# Patient Record
Sex: Male | Born: 1960 | Race: White | Hispanic: No | Smoking: Never smoker
Health system: Southern US, Community
[De-identification: ages and names within clinical notes are randomized; demographics above are authoritative.]

## PROBLEM LIST (undated history)

## (undated) DIAGNOSIS — F329 Major depressive disorder, single episode, unspecified: Secondary | ICD-10-CM

## (undated) DIAGNOSIS — G4730 Sleep apnea, unspecified: Secondary | ICD-10-CM

## (undated) DIAGNOSIS — I10 Essential (primary) hypertension: Secondary | ICD-10-CM

## (undated) DIAGNOSIS — T7840XA Allergy, unspecified, initial encounter: Secondary | ICD-10-CM

## (undated) DIAGNOSIS — M1990 Unspecified osteoarthritis, unspecified site: Secondary | ICD-10-CM

## (undated) DIAGNOSIS — E785 Hyperlipidemia, unspecified: Secondary | ICD-10-CM

## (undated) DIAGNOSIS — F32A Depression, unspecified: Secondary | ICD-10-CM

## (undated) HISTORY — DX: Unspecified osteoarthritis, unspecified site: M19.90

## (undated) HISTORY — DX: Essential (primary) hypertension: I10

## (undated) HISTORY — DX: Depression, unspecified: F32.A

## (undated) HISTORY — DX: Major depressive disorder, single episode, unspecified: F32.9

## (undated) HISTORY — PX: FOOT SURGERY: SHX648

## (undated) HISTORY — DX: Hyperlipidemia, unspecified: E78.5

## (undated) HISTORY — DX: Sleep apnea, unspecified: G47.30

## (undated) HISTORY — PX: OTHER SURGICAL HISTORY: SHX169

## (undated) HISTORY — DX: Allergy, unspecified, initial encounter: T78.40XA

## (undated) HISTORY — PX: TONSILLECTOMY: SUR1361

---

## 2000-03-25 ENCOUNTER — Inpatient Hospital Stay (HOSPITAL_COMMUNITY): Admission: RE | Admit: 2000-03-25 | Discharge: 2000-03-26 | Payer: Self-pay | Admitting: Neurosurgery

## 2000-03-25 ENCOUNTER — Encounter: Payer: Self-pay | Admitting: Neurosurgery

## 2011-11-07 ENCOUNTER — Inpatient Hospital Stay: Payer: Self-pay | Admitting: Internal Medicine

## 2011-11-07 LAB — COMPREHENSIVE METABOLIC PANEL
Albumin: 4.1 g/dL (ref 3.4–5.0)
Alkaline Phosphatase: 79 U/L (ref 50–136)
Anion Gap: 14 (ref 7–16)
BUN: 13 mg/dL (ref 7–18)
Bilirubin,Total: 0.5 mg/dL (ref 0.2–1.0)
Calcium, Total: 8.8 mg/dL (ref 8.5–10.1)
Chloride: 103 mmol/L (ref 98–107)
Co2: 23 mmol/L (ref 21–32)
Creatinine: 0.85 mg/dL (ref 0.60–1.30)
EGFR (African American): >60
EGFR (Non-African Amer.): >60
Glucose: 127 mg/dL — ABNORMAL HIGH (ref 65–99)
Osmolality: 281 (ref 275–301)
Potassium: 4 mmol/L (ref 3.5–5.1)
SGOT(AST): 22 U/L (ref 15–37)
SGPT (ALT): 26 U/L
Sodium: 140 mmol/L (ref 136–145)
Total Protein: 8 g/dL (ref 6.4–8.2)

## 2011-11-07 LAB — CBC
HCT: 41 % (ref 40.0–52.0)
HGB: 13.9 g/dL (ref 13.0–18.0)
MCH: 30.6 pg (ref 26.0–34.0)
MCHC: 33.8 g/dL (ref 32.0–36.0)
MCV: 91 fL (ref 80–100)
Platelet: 200 10*3/uL (ref 150–440)
RBC: 4.54 10*6/uL (ref 4.40–5.90)
RDW: 13.3 % (ref 11.5–14.5)
WBC: 11.4 10*3/uL — ABNORMAL HIGH (ref 3.8–10.6)

## 2011-11-08 LAB — CBC WITH DIFFERENTIAL/PLATELET
Basophil #: 0.1 10*3/uL (ref 0.0–0.1)
Basophil %: 0.8 %
Eosinophil #: 0.3 10*3/uL (ref 0.0–0.7)
Eosinophil %: 3.8 %
HCT: 37.4 % — ABNORMAL LOW (ref 40.0–52.0)
HGB: 12.5 g/dL — ABNORMAL LOW (ref 13.0–18.0)
Lymphocyte #: 2.2 10*3/uL (ref 1.0–3.6)
Lymphocyte %: 30.3 %
MCH: 30.5 pg (ref 26.0–34.0)
MCHC: 33.5 g/dL (ref 32.0–36.0)
MCV: 91 fL (ref 80–100)
Monocyte #: 0.7 10*3/uL (ref 0.0–0.7)
Monocyte %: 10.1 %
Neutrophil #: 3.9 10*3/uL (ref 1.4–6.5)
Neutrophil %: 55 %
Platelet: 181 10*3/uL (ref 150–440)
RBC: 4.1 10*6/uL — ABNORMAL LOW (ref 4.40–5.90)
RDW: 13.3 % (ref 11.5–14.5)
WBC: 7.2 10*3/uL (ref 3.8–10.6)

## 2011-11-08 LAB — BASIC METABOLIC PANEL
Anion Gap: 8 (ref 7–16)
BUN: 10 mg/dL (ref 7–18)
Calcium, Total: 8.4 mg/dL — ABNORMAL LOW (ref 8.5–10.1)
Chloride: 106 mmol/L (ref 98–107)
Co2: 28 mmol/L (ref 21–32)
Creatinine: 1.08 mg/dL (ref 0.60–1.30)
EGFR (African American): >60
EGFR (Non-African Amer.): >60
Glucose: 96 mg/dL (ref 65–99)
Osmolality: 282 (ref 275–301)
Potassium: 5.2 mmol/L — ABNORMAL HIGH (ref 3.5–5.1)
Sodium: 142 mmol/L (ref 136–145)

## 2011-11-08 LAB — MAGNESIUM: Magnesium: 2 mg/dL

## 2011-11-11 LAB — WOUND CULTURE

## 2011-11-12 LAB — CULTURE, BLOOD (SINGLE)

## 2013-02-25 LAB — PSA

## 2014-12-10 NOTE — H&P (Signed)
PATIENT NAME:  Peter Bennett, Peter Bennett MR#:  [id number] DATE OF BIRTH:  [birth year]  DATE OF ADMISSION:  11/07/2011  REFERRING PHYSICIAN: ER physician Dr. Ladona Ridgel PRIMARY CARE PHYSICIAN: Dr. Dossie Arbour  CHIEF COMPLAINT: Right hand fifth finger and hand swelling and redness.   HISTORY OF PRESENT ILLNESS: Patient is a 54 year old male with past medical history of hypertension, depression, allergies who works in Theatre stage manager care. About seven days ago patient was working outdoors cleaning brush and he was wearing rubber gloves. He felt something stick his right fifth finger. The next day patient noticed a small white headed zit which he popped. Subsequently he developed increasing pain, redness and swelling therefore yesterday went to an urgent care where he underwent incision and drainage. He reports that wound cultures were sent from the urgent care. He was started on Norco, minocycline and Septra and sent home. The margins of the erythema were marked on his hand and he was told to seek medical attention if the redness extended the marked area of erythema. This morning patient reports that when he woke up he noticed that the redness was extending beyond the marked line therefore he came to the Emergency Room.   ALLERGIES: No known drug allergies.   PAST MEDICAL HISTORY:  1. Hypertension.  2. Seasonal allergies. 3. Depression.   PAST SURGICAL HISTORY:  1. Back surgery 2000. 2. Tonsillectomy.  3. Foot surgery.   SOCIAL HISTORY: Denies any history of smoking, alcohol or drug abuse. He lives with his mother and helps take care of her. He works in Restaurant manager, fast food care.   FAMILY HISTORY: Mother has hypertension, transient ischemic attack/mini strokes. Father had heart disease and died of congestive heart failure.   REVIEW OF SYSTEMS: CONSTITUTIONAL: Denies any fever, fatigue, weakness. EYES: Denies any blurred or double vision. ENT: Denies tinnitus, ear pain. RESPIRATORY: Denies any cough,  wheezing. CARDIOVASCULAR: Denies any chest pain, orthopnea. GASTROINTESTINAL: Denies any nausea, vomiting, diarrhea, abdominal pain. GENITOURINARY: Denies any dysuria, hematuria. ENDOCRINE: Denies any polyuria, nocturia. HEME/LYMPH: Denies anemia, easy bruisability. INTEGUMENT: Denies any acne, rash. MUSCULOSKELETAL: Reports pain in his right hand and fifth finger. NEUROLOGICAL: Denies any numbness, weakness. PSYCH: Reports a history of anxiety and depression.   PHYSICAL EXAMINATION:  VITAL SIGNS: Temperature 98.5, heart rate 82, respiratory rate 18, blood pressure 153/76, pulse 97% on room air.   GENERAL: Patient is a 54 year old Caucasian male laying comfortably in bed, not in acute distress.   HEAD: Atraumatic, normocephalic.   EYES: There is no pallor, icterus or cyanosis. Pupils equal, round, and reactive to light and accommodation. Extraocular movements intact.   ENT: Wet mucous membranes. No oropharyngeal erythema or thrush.   NECK: Supple. No masses. No JVD. No thyromegaly. No lymphadenopathy.   CHEST WALL: No tenderness to palpation. Not using accessory muscles of respiration. No intercostal muscle retractions.   LUNGS: Bilaterally clear to auscultation. No wheezing, rales, rhonchi.   CARDIOVASCULAR: S1, S2 regular. No murmur, rubs, or gallops.   ABDOMEN: Soft, nontender, nondistended. No guarding. No rigidity. No organomegaly. Normal bowel sounds.   SKIN: No rashes or lesions.   PERIPHERIES: No pedal edema. 2+ pedal pulses.   MUSCULOSKELETAL: No cyanosis, clubbing.   NEUROLOGIC: Awake, alert, oriented x3. Nonfocal neurological exam. Cranial nerves grossly intact.   PSYCH: Normal mood and affect.   RIGHT HAND: There is about a 1 cm incision on the dorsal surface of the patient's right fifth finger surrounded by blackish discoloration possibly Betadine which was applied during incision and drainage  done yesterday. There is some purulent discharge. The area is fluctuant and  very tender. There is erythema extending into the patient's hand and up to the wrist. It is extending beyond the marked line on the hand. Range of motion is reduced in the finger but good in the wrist. He has good pulses.   LABORATORY, DIAGNOSTIC AND RADIOLOGICAL DATA: Blood cultures have been sent. Glucose 127, res of complete metabolic panel is normal. White count 11.4, rest of CBC is normal.   ASSESSMENT AND PLAN: 54 year old male with past medical history of hypertension, allergies, depression presents with right hand abscess/cellulitis.  1. Right hand abscess/cellulitis. Patient reports that he was stuck with something while doing lawn work about a week ago. Subsequently he developed a zit which he popped resulting in increasing pain, redness and swelling. Patient had an incision and drainage done at urgent care 11/06/2011 and was started on antibiotics but presented to the Emergency Room because of increasing erythema extending into his hand. Blood cultures have been sent from the Emergency Room. Will get wound cultures and start on empiric antibiotics, vancomycin and Zosyn, to cover for MRSA, gram-positive, gram-negative and anaerobic bacteria. Will also get an orthopedic consultation. Patient may require further incision and drainage. Also advised patient to apply some ice pack and elevate the hand. Patient's wound also needs to be cleaned and dressed. 2. Hyperglycemia, possibly reactive. Patient has no history of diabetes. 3. Leukocytosis, mild possibly due to acute infection. Will monitor.  4. Hypertension. Mildly elevated possibly due to acute stress. Will continue benazepril and adjust medications as needed to achieve good hypertensive control.  5. Seasonal allergies. Will continue Zyrtec. 6. History of depression. Will continue Effexor.  7. Will place on GI and DVT prophylaxis.   Discussed with patient the plan of care and management.   TIME SPENT: 75 minutes.    ____________________________ Darrick MeigsSangeeta Ashey Tramontana, MD sp:cms D: 11/07/2011 08:10:45 ET T: 11/07/2011 08:41:25 ET JOB#: [id number]  cc: Darrick MeigsSangeeta Joe Tanney, MD, <Dictator> Steele SizerMark A. Crissman, MD Darrick MeigsSANGEETA Chika Cichowski MD ELECTRONICALLY SIGNED 11/07/2011 13:43

## 2014-12-10 NOTE — Consult Note (Signed)
PATIENT NAME:  Peter Bennett, Kenta E MR#:  [id number] DATE OF BIRTH:  [birth year]  DATE OF CONSULTATION:  11/08/2011  REFERRING PHYSICIAN:  Dr. Dava NajjarPanwar  CONSULTING PHYSICIAN:  Winn JockJames C. Kathrine Rieves, MD  REASON FOR CONSULTATION: 54 year old male with an infection of his right fifth digit.   HISTORY OF PRESENT ILLNESS: The patient suffered what may have been a snake bite to his right fifth digit approximately five days ago. He said he was clearing brush and felt an acute pressure and sharp pain in his right small finger dorsal aspect. He noticed a small reddened area which became progressively worse. He noticed a small carbuncle for which apparently he went to Urgent Care. He underwent incision and drainage. Wound cultures were sent. He was placed on Norco, minocycline, and Septra. He continued to have discomfort. He presented to the Emergency Room on 03/22 and was evaluated by Medicine and admitted.   No prior difficulty with his hand.   PAST MEDICAL HISTORY:  1. Hypertension.  2. Seasonal allergies. 3. Depression.   PAST SURGICAL HISTORY:  1. Back surgery.  2. Tonsillectomy. 3. Foot surgery.   SOCIAL HISTORY: Does not smoke or drink. He lives with his mother, helps to take care of her, works in Aeronautical engineerlandscaping.   FAMILY HISTORY: Notable for hypertension and vascular disease.   REVIEW OF SYSTEMS: Unremarkable for any acute GI or GU symptoms. No fevers, chills, or constitutional symptoms.   PHYSICAL EXAMINATION: Well developed male. He is alert and oriented. Hand is in a soft dressing. This is removed. There is approximately a 1 cm long area which is open. There was surrounding erythema. There was erythema on the dorsum of the hand. There is diffuse tenderness. There is marked decreased range of motion of the fifth digit actively. Passively range of motion is full but mildly painful. Slight purulence is present. Neurovascular examination is intact. There were no epitrochlear nodes about the elbow.    LABORATORY DATA: Laboratory work is reviewed. Culture has shown Staph aureus. Blood cultures were unremarkable. White count 11.4. Differential is mildly elevated.   IMPRESSION: Wound dorsum of the fifth digit most likely from a snake bite. There is significant infection but he has improved a little bit since yesterday. Based on this would recommend exploring the wound, possible I and D. Will do this in the room under local. This is with informed consent.   PROCEDURE: Betadine prep followed by digital block and infiltration of wound with plain Xylocaine. The area is explored and debrided. There is minimal purulence. There is one small piece of necrotic-type tissue which is cleaned out with a swab. The wound is very well defined. The area is irrigated with more local. A soft sterile dressing is applied.   IMPRESSION: Infection, improving. Very well defined. Recommend changing to p.o. antibiotics and discharging him when final sensitivities are in. Will see him back in the office in 7 to 10 days.   ____________________________ Winn JockJames C. Gerrit Heckaliff, MD jcc:drc D: 11/09/2011 15:15:54 ET T: 11/09/2011 15:47:20 ET JOB#: [id number]  cc: Winn JockJames C. Gerrit Heckaliff, MD, <Dictator> Winn JockJAMES C Isais Klipfel MD ELECTRONICALLY SIGNED 12/02/2011 12:54

## 2014-12-10 NOTE — Discharge Summary (Signed)
PATIENT NAME:  Peter Bennett, Wing E MR#:  [id number] DATE OF BIRTH:  03-Jun-1961  DATE OF ADMISSION:  11/07/2011 DATE OF DISCHARGE:  11/09/2011  DISCHARGE DIAGNOSES:  1. Right hand cellulitis, right fifth finger abscess. 2. Hypertension. 3. Allergies.  4. Depression.  5. Leukocytosis.  6. Hyperglycemia.   DISPOSITION: The patient is being discharged home.   DISCHARGE FOLLOWUP: Followup with Dr. Dossie Arbourrissman in 1 to 2 weeks after discharge. Followup with  Dr. Gerrit Heckaliff in 2 to 3 days after discharge.   DIET: Low sodium.   ACTIVITY: As tolerated.   DISCHARGE INSTRUCTIONS: The patient has been advised that he can shower, but he needs to keep his right hand clean and dry. He should clean the area with Betadine and apply a dressing twice a day.   DISCHARGE MEDICATIONS:  1. Bactrim double-strength one tablet p.o. twice a day for 10 days.  2. Doxycycline 100 mg p.o. twice a day for 10 days.  3. Norco 5/325 mg every six hours p.r.n.  4. Effexor 150 mg daily.  5. Aspirin 81 mg daily. 6. Benazepril 10 mg daily. 7. Zyrtec 10 mg daily.   RESULTS: Wound culture grew MRSA.   White count 11.4 on admission and 7.2 by the time of discharge. The rest of the CBC essentially normal. CMP essentially normal.   HOSPITAL COURSE: The patient is a 54 year old male with past medical history of hypertension and allergies who works in [employer] business. He presented with right hand cellulitis and right fifth finger abscess. For additional details, please see the History and Physical. The patient was admitted to the hospital because he did not respond to outpatient antibiotics. He was started on vancomycin and Zosyn and an orthopedic consultation with Dr. Gerrit Heckaliff was obtained who did a bedside incision and drainage. The wound culture grew MRSA. Clinically over the last 48 hours there has been significant improvement in the patient's cellulitis. He is being discharged home on oral antibiotics. He has been advised to  follow-up with Dr. Gerrit Heckaliff in 2 to 3 days after discharge. He had some hyperglycemia. His hemoglobin A1c is 5.9. His leukocytosis has resolved. The rest of his medical care problems remained stable during the hospitalization. He is being discharged home in a stable condition.   TIME SPENT: 45 minutes.  ____________________________ Darrick MeigsSangeeta An Lannan, MD sp:slb D: 11/09/2011 14:03:09 ET T: 11/10/2011 09:57:06 ET JOB#: [id number]  cc: Darrick MeigsSangeeta Zayanna Pundt, MD, <Dictator> Steele SizerMark A. Crissman, MD Darrick MeigsSANGEETA Tommye Lehenbauer MD ELECTRONICALLY SIGNED 11/10/2011 16:41

## 2014-12-10 NOTE — Consult Note (Signed)
Brief Consult Note: Diagnosis: Right hand celluitis/abcess.   Patient was seen by consultant.   Comments: Will check back in AM. If not improved, will take to OR. Agree with present antibiotics.  Electronic Signatures: Celesta Gentilealiff, Jedidiah Demartini C (MD)  (Signed 22-Mar-13 19:32)  Authored: Brief Consult Note   Last Updated: 22-Mar-13 19:32 by Celesta Gentilealiff, Zafirah Vanzee C (MD)

## 2015-03-31 ENCOUNTER — Other Ambulatory Visit: Payer: Self-pay | Admitting: Unknown Physician Specialty

## 2015-04-12 DIAGNOSIS — G4733 Obstructive sleep apnea (adult) (pediatric): Secondary | ICD-10-CM | POA: Insufficient documentation

## 2015-04-12 DIAGNOSIS — G4730 Sleep apnea, unspecified: Secondary | ICD-10-CM | POA: Insufficient documentation

## 2015-04-12 DIAGNOSIS — E785 Hyperlipidemia, unspecified: Secondary | ICD-10-CM | POA: Insufficient documentation

## 2015-04-12 DIAGNOSIS — F32A Depression, unspecified: Secondary | ICD-10-CM

## 2015-04-12 DIAGNOSIS — F329 Major depressive disorder, single episode, unspecified: Secondary | ICD-10-CM

## 2015-04-12 DIAGNOSIS — J309 Allergic rhinitis, unspecified: Secondary | ICD-10-CM | POA: Insufficient documentation

## 2015-04-12 DIAGNOSIS — N529 Male erectile dysfunction, unspecified: Secondary | ICD-10-CM | POA: Insufficient documentation

## 2015-04-12 DIAGNOSIS — E781 Pure hyperglyceridemia: Secondary | ICD-10-CM

## 2015-04-12 DIAGNOSIS — F339 Major depressive disorder, recurrent, unspecified: Secondary | ICD-10-CM | POA: Insufficient documentation

## 2015-04-12 DIAGNOSIS — I10 Essential (primary) hypertension: Secondary | ICD-10-CM

## 2015-04-16 ENCOUNTER — Ambulatory Visit (INDEPENDENT_AMBULATORY_CARE_PROVIDER_SITE_OTHER): Payer: Self-pay | Admitting: Unknown Physician Specialty

## 2015-04-16 ENCOUNTER — Encounter: Payer: Self-pay | Admitting: Unknown Physician Specialty

## 2015-04-16 VITALS — BP 121/70 | HR 71 | Temp 98.2°F | Ht 70.3 in | Wt 214.8 lb

## 2015-04-16 DIAGNOSIS — F32A Depression, unspecified: Secondary | ICD-10-CM

## 2015-04-16 DIAGNOSIS — N529 Male erectile dysfunction, unspecified: Secondary | ICD-10-CM

## 2015-04-16 DIAGNOSIS — I10 Essential (primary) hypertension: Secondary | ICD-10-CM

## 2015-04-16 DIAGNOSIS — F329 Major depressive disorder, single episode, unspecified: Secondary | ICD-10-CM

## 2015-04-16 MED ORDER — CYCLOBENZAPRINE HCL 10 MG PO TABS: 10.0000 mg | ORAL_TABLET | Freq: Three times a day (TID) | ORAL | Status: DC | PRN

## 2015-04-16 MED ORDER — VENLAFAXINE HCL ER 150 MG PO CP24: 150.0000 mg | ORAL_CAPSULE | Freq: Every day | ORAL | Status: DC

## 2015-04-16 MED ORDER — VARDENAFIL HCL 10 MG PO TABS: 10.0000 mg | ORAL_TABLET | Freq: Every day | ORAL | Status: DC | PRN

## 2015-04-16 MED ORDER — BENAZEPRIL HCL 20 MG PO TABS: 20.0000 mg | ORAL_TABLET | Freq: Every day | ORAL | Status: DC

## 2015-04-16 NOTE — Assessment & Plan Note (Signed)
Stable, continue present medications.   

## 2015-04-16 NOTE — Progress Notes (Signed)
BP 121/70 mmHg  Pulse 71  Temp(Src) 98.2 F (36.8 C)  Ht 5' 10.3" (1.786 m)  Wt 214 lb 12.8 oz (97.433 kg)  BMI 30.55 kg/m2  SpO2 99%   Subjective:    Patient ID: Peter Bennett, male    DOB: Feb 10, 1961, 54 y.o.   MRN: [id number]  HPI: Peter Bennett is a 54 y.o. male  Chief Complaint  Patient presents with  . Depression    pt states he needs refills on all medications  . Hypertension   1.  DEPRESSION Duration:  chronic H4 Depressed mood:  yes  H2 .H: Depression Screen X  Anhedonia:  yes  H3 Significant weight loss or gain:  no  H8 Insomnia:  yes  hard to stay asleep.  H8 Hypersomnia:  yes  H8 Fatigue/loss of energy:  yes  H8 Feelings of worthlessness:  no  H8 Feelings of guilt:  no  H8 Impaired concentration/indecisiveness:  no  H8 Suicidal ideations:  no  H8 Crying spells:  no  H8 Anxious mood:  no  H8  Recent Stressors/Life Changes:   2.  HYPERTENSION  Hypertension status:  controlled  H6  Satisfied with current treatment?  yes  H6  Duration of hypertension:  chronic  H4  BP monitoring frequency:  not checking.  H5  BP medication side effects:  no P1  Medication compliance:  excellent compliance  P1  Previous BP meds:  none  P1  Aspirin:  Aspirin Therapy Done on 03/12/09 Recurrent headaches:  no  R10 Visual changes:  no  R2  Palpitations:  no  R4  Dyspnea:  no  R5  Chest pain:  no  R4 Lower extremity edema:  no  R4 Dizzy/lightheaded:  no  R10     Relevant past medical, surgical, family and social history reviewed and updated as indicated. Interim medical history since our last visit reviewed. Allergies and medications reviewed and updated.  Review of Systems  Per HPI unless specifically indicated above     Objective:    BP 121/70 mmHg  Pulse 71  Temp(Src) 98.2 F (36.8 C)  Ht 5' 10.3" (1.786 m)  Wt 214 lb 12.8 oz (97.433 kg)  BMI 30.55 kg/m2  SpO2 99%  Wt Readings from Last 3 Encounters:  04/16/15 214 lb 12.8 oz (97.433 kg)   09/26/14 226 lb (102.513 kg)    Physical Exam  Constitutional: He is oriented to person, place, and time. He appears well-developed and well-nourished. No distress.  HENT:  Head: Normocephalic and atraumatic.  Eyes: Conjunctivae and lids are normal. Right eye exhibits no discharge. Left eye exhibits no discharge. No scleral icterus.  Cardiovascular: Normal rate, regular rhythm and normal heart sounds.   Pulmonary/Chest: Effort normal and breath sounds normal. No respiratory distress.  Abdominal: Normal appearance. There is no splenomegaly or hepatomegaly. There is no tenderness.  Musculoskeletal: Normal range of motion.  Neurological: He is alert and oriented to person, place, and time.  Skin: Skin is intact. No rash noted. No pallor.  Psychiatric: He has a normal mood and affect. His behavior is normal. Judgment and thought content normal.      Assessment & Plan:   Problem List Items Addressed This Visit      Unprioritized   Depression    Stable, continue present medications.       Relevant Medications   venlafaxine XR (EFFEXOR-XR) 150 MG 24 hr capsule   ED (erectile dysfunction)    Stable, continue present medications.  Hypertension - Primary    Stable, continue present medications.       Relevant Medications   benazepril (LOTENSIN) 20 MG tablet   vardenafil (LEVITRA) 10 MG tablet       Follow up plan: Return in about 6 months (around 10/16/2015) for phyxical.

## 2015-10-16 ENCOUNTER — Encounter: Payer: Self-pay | Admitting: Family Medicine

## 2015-10-25 ENCOUNTER — Encounter: Payer: Self-pay | Admitting: Family Medicine

## 2015-10-25 ENCOUNTER — Ambulatory Visit (INDEPENDENT_AMBULATORY_CARE_PROVIDER_SITE_OTHER): Payer: Self-pay | Admitting: Family Medicine

## 2015-10-25 VITALS — BP 132/79 | HR 86 | Temp 98.6°F | Ht 69.5 in | Wt 227.0 lb

## 2015-10-25 DIAGNOSIS — F329 Major depressive disorder, single episode, unspecified: Secondary | ICD-10-CM

## 2015-10-25 DIAGNOSIS — I10 Essential (primary) hypertension: Secondary | ICD-10-CM

## 2015-10-25 DIAGNOSIS — F32A Depression, unspecified: Secondary | ICD-10-CM

## 2015-10-25 MED ORDER — VENLAFAXINE HCL ER 150 MG PO CP24: 150.0000 mg | ORAL_CAPSULE | Freq: Every day | ORAL | Status: DC

## 2015-10-25 MED ORDER — CYCLOBENZAPRINE HCL 10 MG PO TABS: 10.0000 mg | ORAL_TABLET | Freq: Three times a day (TID) | ORAL | Status: DC | PRN

## 2015-10-25 MED ORDER — TRIAMCINOLONE ACETONIDE 0.1 % EX CREA: 1.0000 "application " | TOPICAL_CREAM | Freq: Two times a day (BID) | CUTANEOUS | Status: DC

## 2015-10-25 MED ORDER — BENAZEPRIL HCL 20 MG PO TABS: 20.0000 mg | ORAL_TABLET | Freq: Every day | ORAL | Status: DC

## 2015-10-25 NOTE — Assessment & Plan Note (Signed)
The current medical regimen is effective;  continue present plan and medications.  

## 2015-10-25 NOTE — Progress Notes (Signed)
   BP 132/79 mmHg  Pulse 86  Temp(Src) 98.6 F (37 C)  Ht 5' 9.5" (1.765 m)  Wt 227 lb (102.967 kg)  BMI 33.05 kg/m2  SpO2 99%   Subjective:    Patient ID: Peter Bennett, male    DOB: 01-19-1961, 55 y.o.   MRN: [id number]  HPI: Peter Bennett is a 55 y.o. male  Chief Complaint  Patient presents with  . medication refills   patient follow-up doing well no complaints from blood pressure blood pressures get at home here in no side effects from medications which he takes faithfully Nerves doing well with Effexor no complaints Allergies doing fine has some occasional hip pains wants some Flexeril hasn't taken any for a long time but wants to have a refill just in case   Relevant past medical, surgical, family and social history reviewed and updated as indicated. Interim medical history since our last visit reviewed. Allergies and medications reviewed and updated.  Review of Systems  Constitutional: Negative.   Respiratory: Negative.   Cardiovascular: Negative.     Per HPI unless specifically indicated above     Objective:    BP 132/79 mmHg  Pulse 86  Temp(Src) 98.6 F (37 C)  Ht 5' 9.5" (1.765 m)  Wt 227 lb (102.967 kg)  BMI 33.05 kg/m2  SpO2 99%  Wt Readings from Last 3 Encounters:  10/25/15 227 lb (102.967 kg)  04/16/15 214 lb 12.8 oz (97.433 kg)  09/26/14 226 lb (102.513 kg)    Physical Exam  Constitutional: He is oriented to person, place, and time. He appears well-developed and well-nourished. No distress.  HENT:  Head: Normocephalic and atraumatic.  Right Ear: Hearing normal.  Left Ear: Hearing normal.  Nose: Nose normal.  Eyes: Conjunctivae and lids are normal. Right eye exhibits no discharge. Left eye exhibits no discharge. No scleral icterus.  Pulmonary/Chest: Effort normal. No respiratory distress.  Musculoskeletal: Normal range of motion.  Neurological: He is alert and oriented to person, place, and time.  Skin: Skin is intact. No rash noted.   Psychiatric: He has a normal mood and affect. His speech is normal and behavior is normal. Judgment and thought content normal. Cognition and memory are normal.    Results for orders placed or performed in visit on 04/12/15  PSA  Result Value Ref Range   PSA from PP       Assessment & Plan:   Problem List Items Addressed This Visit      Cardiovascular and Mediastinum   Hypertension - Primary    The current medical regimen is effective;  continue present plan and medications.       Relevant Medications   benazepril (LOTENSIN) 20 MG tablet     Other   Depression    The current medical regimen is effective;  continue present plan and medications.       Relevant Medications   venlafaxine XR (EFFEXOR-XR) 150 MG 24 hr capsule       Follow up plan: Return in about 6 months (around 04/26/2016) for med check.

## 2015-10-26 LAB — BASIC METABOLIC PANEL
BUN / CREAT RATIO: 20 (ref 9–20)
BUN: 14 mg/dL (ref 6–24)
CHLORIDE: 101 mmol/L (ref 96–106)
CO2: 24 mmol/L (ref 18–29)
Calcium: 8.9 mg/dL (ref 8.7–10.2)
Creatinine, Ser: 0.69 mg/dL — ABNORMAL LOW (ref 0.76–1.27)
GFR calc Af Amer: 123 mL/min/{1.73_m2} (ref >59)
GFR calc non Af Amer: 106 mL/min/{1.73_m2} (ref >59)
Glucose: 73 mg/dL (ref 65–99)
POTASSIUM: 4.5 mmol/L (ref 3.5–5.2)
SODIUM: 141 mmol/L (ref 134–144)

## 2015-10-29 ENCOUNTER — Encounter: Payer: Self-pay | Admitting: Family Medicine

## 2016-04-29 ENCOUNTER — Ambulatory Visit: Payer: Self-pay | Admitting: Family Medicine

## 2016-05-01 ENCOUNTER — Other Ambulatory Visit: Payer: Self-pay | Admitting: Family Medicine

## 2016-05-05 ENCOUNTER — Ambulatory Visit: Payer: Self-pay | Admitting: Family Medicine

## 2016-05-22 ENCOUNTER — Ambulatory Visit (INDEPENDENT_AMBULATORY_CARE_PROVIDER_SITE_OTHER): Payer: Self-pay | Admitting: Family Medicine

## 2016-05-22 ENCOUNTER — Encounter: Payer: Self-pay | Admitting: Family Medicine

## 2016-05-22 DIAGNOSIS — F3342 Major depressive disorder, recurrent, in full remission: Secondary | ICD-10-CM

## 2016-05-22 DIAGNOSIS — G4733 Obstructive sleep apnea (adult) (pediatric): Secondary | ICD-10-CM

## 2016-05-22 DIAGNOSIS — L989 Disorder of the skin and subcutaneous tissue, unspecified: Secondary | ICD-10-CM | POA: Insufficient documentation

## 2016-05-22 DIAGNOSIS — I10 Essential (primary) hypertension: Secondary | ICD-10-CM

## 2016-05-22 MED ORDER — VENLAFAXINE HCL ER 150 MG PO CP24: 150.0000 mg | ORAL_CAPSULE | Freq: Every day | ORAL | 2 refills | Status: DC

## 2016-05-22 MED ORDER — BENAZEPRIL HCL 20 MG PO TABS
20.0000 mg | ORAL_TABLET | Freq: Every day | ORAL | 2 refills | Status: DC
Start: 2016-05-22 — End: 2017-03-23

## 2016-05-22 NOTE — Assessment & Plan Note (Signed)
Discuss multiple actinic keratosis will refer to dermatology for skin check

## 2016-05-22 NOTE — Assessment & Plan Note (Signed)
Discuss using CPAP again K fresh prescription for new supplies may help fatigue

## 2016-05-22 NOTE — Assessment & Plan Note (Signed)
The current medical regimen is effective;  continue present plan and medications.  

## 2016-05-22 NOTE — Progress Notes (Signed)
BP 124/76 (BP Location: Left Arm, Patient Position: Sitting, Cuff Size: Normal)   Pulse 80   Temp 98.1 F (36.7 C)   Ht 5\' 10"  (1.778 m)   Wt 225 lb 12.8 oz (102.4 kg)   SpO2 99%   BMI 32.40 kg/m    Subjective:    Patient ID: Peter Bennett, male    DOB: [birth year], 55 y.o.   MRN: [id number]  HPI: Peter Bennett is a 55 y.o. male  Chief Complaint  Patient presents with  . Follow-up  . Med Check   Patient with marked ongoing fatigue can sleep 12+ hours not really depressed but but tired all the time. Not using CPAP or has not used for some time Taking Effexor faithfully without side effects Takes blood pressure without problems  Relevant past medical, surgical, family and social history reviewed and updated as indicated. Interim medical history since our last visit reviewed. Allergies and medications reviewed and updated.  Review of Systems  Constitutional: Negative.   Respiratory: Negative.   Cardiovascular: Negative.     Per HPI unless specifically indicated above     Objective:    BP 124/76 (BP Location: Left Arm, Patient Position: Sitting, Cuff Size: Normal)   Pulse 80   Temp 98.1 F (36.7 C)   Ht 5\' 10"  (1.778 m)   Wt 225 lb 12.8 oz (102.4 kg)   SpO2 99%   BMI 32.40 kg/m   Wt Readings from Last 3 Encounters:  05/22/16 225 lb 12.8 oz (102.4 kg)  10/25/15 227 lb (103 kg)  04/16/15 214 lb 12.8 oz (97.4 kg)    Physical Exam  Constitutional: He is oriented to person, place, and time. He appears well-developed and well-nourished. No distress.  HENT:  Head: Normocephalic and atraumatic.  Right Ear: Hearing normal.  Left Ear: Hearing normal.  Nose: Nose normal.  Eyes: Conjunctivae and lids are normal. Right eye exhibits no discharge. Left eye exhibits no discharge. No scleral icterus.  Cardiovascular: Normal rate, regular rhythm and normal heart sounds.   Pulmonary/Chest: Effort normal and breath sounds normal. No respiratory distress.    Musculoskeletal: Normal range of motion.  Neurological: He is alert and oriented to person, place, and time.  Skin: Skin is intact. No rash noted.  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 His speech is normal and behavior is normal. Judgment and thought content normal. Cognition and memory are normal.    Results for orders placed or performed in visit on 10/25/15  Basic metabolic panel  Result Value Ref Range   Glucose 73 65 - 99 mg/dL   BUN 14 6 - 24 mg/dL   Creatinine, Ser 7.820.69 (L) 0.76 - 1.27 mg/dL   GFR calc non Af Amer 106 >59 mL/min/1.73   GFR calc Af Amer 123 >59 mL/min/1.73   BUN/Creatinine Ratio 20 9 - 20   Sodium 141 134 - 144 mmol/L   Potassium 4.5 3.5 - 5.2 mmol/L   Chloride 101 96 - 106 mmol/L   CO2 24 18 - 29 mmol/L   Calcium 8.9 8.7 - 10.2 mg/dL      Assessment & Plan:   Problem List Items Addressed This Visit      Cardiovascular and Mediastinum   Hypertension    The current medical regimen is effective;  continue present plan and medications.       Relevant Medications   benazepril (LOTENSIN) 20 MG tablet   Other Relevant  Orders   Basic metabolic panel     Respiratory   Sleep apnea    Discuss using CPAP again K fresh prescription for new supplies may help fatigue        Musculoskeletal and Integument   Skin lesions    Discuss multiple actinic keratosis will refer to dermatology for skin check        Other   Depression    The current medical regimen is effective;  continue present plan and medications.       Relevant Medications   venlafaxine XR (EFFEXOR-XR) 150 MG 24 hr capsule    Other Visit Diagnoses   None.      Follow up plan: Return in about 6 months (around 11/20/2016) for Physical Exam.

## 2016-05-23 LAB — BASIC METABOLIC PANEL
BUN/Creatinine Ratio: 20 (ref 9–20)
BUN: 16 mg/dL (ref 6–24)
CALCIUM: 9.4 mg/dL (ref 8.7–10.2)
CO2: 27 mmol/L (ref 18–29)
CREATININE: 0.82 mg/dL (ref 0.76–1.27)
Chloride: 99 mmol/L (ref 96–106)
GFR, EST AFRICAN AMERICAN: 114 mL/min/{1.73_m2} (ref >59)
GFR, EST NON AFRICAN AMERICAN: 99 mL/min/{1.73_m2} (ref >59)
Glucose: 68 mg/dL (ref 65–99)
POTASSIUM: 4.4 mmol/L (ref 3.5–5.2)
SODIUM: 139 mmol/L (ref 134–144)

## 2016-05-26 ENCOUNTER — Encounter: Payer: Self-pay | Admitting: Family Medicine

## 2016-06-17 ENCOUNTER — Other Ambulatory Visit: Payer: Self-pay

## 2016-06-17 MED ORDER — FLUTICASONE PROPIONATE 50 MCG/ACT NA SUSP: 2.0000 | Freq: Every day | NASAL | 12 refills | Status: DC

## 2016-06-17 NOTE — Telephone Encounter (Signed)
Routing to provider  

## 2016-07-22 ENCOUNTER — Ambulatory Visit (INDEPENDENT_AMBULATORY_CARE_PROVIDER_SITE_OTHER): Payer: Self-pay | Admitting: Family Medicine

## 2016-07-22 ENCOUNTER — Encounter: Payer: Self-pay | Admitting: Family Medicine

## 2016-07-22 DIAGNOSIS — F3342 Major depressive disorder, recurrent, in full remission: Secondary | ICD-10-CM

## 2016-07-22 DIAGNOSIS — I10 Essential (primary) hypertension: Secondary | ICD-10-CM

## 2016-07-22 NOTE — Assessment & Plan Note (Signed)
The current medical regimen is effective;  continue present plan and medications.  

## 2016-07-22 NOTE — Progress Notes (Signed)
   BP 124/78 (BP Location: Left Arm, Patient Position: Sitting, Cuff Size: Normal)   Pulse 81   Temp 97.8 F (36.6 C)   Ht 5' 10.75" (1.797 m)   Wt 227 lb 12.8 oz (103.3 kg)   SpO2 99%   BMI 32.00 kg/m    Subjective:    Patient ID: Peter Bennett, male    DOB: 1961-04-02, 55 y.o.   MRN: [id number]  HPI: Peter Bennett is a 55 y.o. male  Chief Complaint  Patient presents with  . Paperwork  Patient for [facility] 352DMV paperwork for blood pressure and depression otherwise been doing well no complaints with blood pressure or depression medications taken faithfully without problems  Relevant past medical, surgical, family and social history reviewed and updated as indicated. Interim medical history since our last visit reviewed. Allergies and medications reviewed and updated.  Review of Systems  Constitutional: Negative.   Respiratory: Negative.   Cardiovascular: Negative.     Per HPI unless specifically indicated above     Objective:    BP 124/78 (BP Location: Left Arm, Patient Position: Sitting, Cuff Size: Normal)   Pulse 81   Temp 97.8 F (36.6 C)   Ht 5' 10.75" (1.797 m)   Wt 227 lb 12.8 oz (103.3 kg)   SpO2 99%   BMI 32.00 kg/m   Wt Readings from Last 3 Encounters:  07/22/16 227 lb 12.8 oz (103.3 kg)  05/22/16 225 lb 12.8 oz (102.4 kg)  10/25/15 227 lb (103 kg)    Physical Exam  Constitutional: He is oriented to person, place, and time. He appears well-developed and well-nourished. No distress.  HENT:  Head: Normocephalic and atraumatic.  Right Ear: Hearing normal.  Left Ear: Hearing normal.  Nose: Nose normal.  Eyes: Conjunctivae and lids are normal. Right eye exhibits no discharge. Left eye exhibits no discharge. No scleral icterus.  Pulmonary/Chest: Effort normal. No respiratory distress.  Musculoskeletal: Normal range of motion.  Neurological: He is alert and oriented to person, place, and time.  Skin: Skin is intact. No rash noted.  Psychiatric: He has a  normal mood and affect. His speech is normal and behavior is normal. Judgment and thought content normal. Cognition and memory are normal.    Results for orders placed or performed in visit on 05/22/16  Basic metabolic panel  Result Value Ref Range   Glucose 68 65 - 99 mg/dL   BUN 16 6 - 24 mg/dL   Creatinine, Ser 4.090.82 0.76 - 1.27 mg/dL   GFR calc non Af Amer 99 >59 mL/min/1.73   GFR calc Af Amer 114 >59 mL/min/1.73   BUN/Creatinine Ratio 20 9 - 20   Sodium 139 134 - 144 mmol/L   Potassium 4.4 3.5 - 5.2 mmol/L   Chloride 99 96 - 106 mmol/L   CO2 27 18 - 29 mmol/L   Calcium 9.4 8.7 - 10.2 mg/dL      Assessment & Plan:   Problem List Items Addressed This Visit      Cardiovascular and Mediastinum   Hypertension    The current medical regimen is effective;  continue present plan and medications.         Other   Depression    The current medical regimen is effective;  continue present plan and medications.          DMV form filled out see copy for details Follow up plan: Return for As scheduled.

## 2016-12-04 ENCOUNTER — Encounter: Payer: Self-pay | Admitting: Family Medicine

## 2017-03-20 ENCOUNTER — Other Ambulatory Visit: Payer: Self-pay | Admitting: Family Medicine

## 2017-03-23 MED ORDER — VENLAFAXINE HCL ER 150 MG PO CP24: 150.0000 mg | ORAL_CAPSULE | Freq: Every day | ORAL | 0 refills | Status: DC

## 2017-03-23 MED ORDER — BENAZEPRIL HCL 20 MG PO TABS: 20.0000 mg | ORAL_TABLET | Freq: Every day | ORAL | 0 refills | Status: DC

## 2017-03-23 NOTE — Telephone Encounter (Signed)
apt 

## 2017-03-23 NOTE — Telephone Encounter (Signed)
Last OV: 07/22/16 Next OV: None on file.   BMP Latest Ref Rng & Units 05/22/2016 10/25/2015 11/08/2011  Glucose 65 - 99 mg/dL 68 73 96  BUN 6 - 24 mg/dL 16 14 10   Creatinine 0.76 - 1.27 mg/dL 1.610.82 0.96(E0.69(L) 4.541.08  BUN/Creat Ratio 9 - 20 20 20  -  Sodium 134 - 144 mmol/L 139 141 142  Potassium 3.5 - 5.2 mmol/L 4.4 4.5 5.2(H)  Chloride 96 - 106 mmol/L 99 101 106  CO2 18 - 29 mmol/L 27 24 28   Calcium 8.7 - 10.2 mg/dL 9.4 8.9 0.9(W8.4(L)

## 2017-05-25 ENCOUNTER — Ambulatory Visit (INDEPENDENT_AMBULATORY_CARE_PROVIDER_SITE_OTHER): Payer: Self-pay | Admitting: Family Medicine

## 2017-05-25 ENCOUNTER — Encounter: Payer: Self-pay | Admitting: Family Medicine

## 2017-05-25 VITALS — BP 130/82 | HR 74 | Temp 97.7°F | Ht 70.0 in | Wt 227.9 lb

## 2017-05-25 DIAGNOSIS — I10 Essential (primary) hypertension: Secondary | ICD-10-CM

## 2017-05-25 DIAGNOSIS — J309 Allergic rhinitis, unspecified: Secondary | ICD-10-CM

## 2017-05-25 DIAGNOSIS — F3342 Major depressive disorder, recurrent, in full remission: Secondary | ICD-10-CM

## 2017-05-25 DIAGNOSIS — N529 Male erectile dysfunction, unspecified: Secondary | ICD-10-CM

## 2017-05-25 MED ORDER — VENLAFAXINE HCL ER 150 MG PO CP24: 150.0000 mg | ORAL_CAPSULE | Freq: Every day | ORAL | 1 refills | Status: DC

## 2017-05-25 MED ORDER — TRIAMCINOLONE ACETONIDE 0.1 % EX CREA: 1.0000 "application " | TOPICAL_CREAM | Freq: Two times a day (BID) | CUTANEOUS | 0 refills | Status: DC

## 2017-05-25 MED ORDER — CYCLOBENZAPRINE HCL 10 MG PO TABS: 10.0000 mg | ORAL_TABLET | Freq: Three times a day (TID) | ORAL | 1 refills | Status: DC | PRN

## 2017-05-25 MED ORDER — BENAZEPRIL HCL 20 MG PO TABS: 20.0000 mg | ORAL_TABLET | Freq: Every day | ORAL | 1 refills | Status: DC

## 2017-05-25 MED ORDER — VARDENAFIL HCL 10 MG PO TABS: 10.0000 mg | ORAL_TABLET | Freq: Every day | ORAL | 12 refills | Status: DC | PRN

## 2017-05-25 MED ORDER — FLUTICASONE PROPIONATE 50 MCG/ACT NA SUSP: 2.0000 | Freq: Every day | NASAL | 12 refills | Status: DC

## 2017-05-25 NOTE — Assessment & Plan Note (Signed)
Doing well with prn levitra. Continue current regimen

## 2017-05-25 NOTE — Assessment & Plan Note (Signed)
Doing well on benazepril, continue current regimen and occasional home monitoring. Refused BMP today, discussed with pt that it would have to be drawn at next visit in 6 months to assess kidney function.

## 2017-05-25 NOTE — Assessment & Plan Note (Signed)
Stable on effexor. Continue current regimen

## 2017-05-25 NOTE — Patient Instructions (Addendum)
Follow up in 6 months 

## 2017-05-25 NOTE — Progress Notes (Addendum)
BP 130/82   Pulse 74   Temp 97.7 F (36.5 C)   Ht  (1.778 m)   Wt 227 lb 14.4 oz (103.4 kg)   SpO2 99%   BMI 32.70 kg/m    Subjective:    Patient ID: Peter Bennett, male    DOB: 02-Apr-1961, 56 y.o.   MRN: [id number]  HPI: Peter Bennett is a 56 y.o. male  Chief Complaint  Patient presents with  . Medication Refill   Patient presents today for chronic disease management. Doing well with medications, no concerns and taking them faithfully without side effects. Checks BPs occasionally outside of office, states always WNL. Denies CP, SOB, palpitations, syncope. Effexor controlling moods fairly well, does not want to change dose or medication at this time. Levitra working well on prn basis. Flonase and generic zyrtec controlling allergies well.   Past Medical History:  Diagnosis Date  . Allergy   . Depression   . Hyperlipidemia   . Hypertension   . Osteoarthritis   . Sleep apnea    Social History   Social History  . Marital status: Divorced    Spouse name: N/A  . Number of children: N/A  . Years of education: N/A   Occupational History  . Not on file.   Social History Main Topics  . Smoking status: Never Smoker  . Smokeless tobacco: Current User    Types: Chew  . Alcohol use No  . Drug use: No  . Sexual activity: Yes   Other Topics Concern  . Not on file   Social History Narrative  . No narrative on file   Relevant past medical, surgical, family and social history reviewed and updated as indicated. Interim medical history since our last visit reviewed. Allergies and medications reviewed and updated.  Review of Systems  Constitutional: Negative.   HENT: Negative.   Eyes: Negative.   Respiratory: Negative.   Cardiovascular: Negative.   Gastrointestinal: Negative.   Musculoskeletal: Negative.   Neurological: Negative.   Psychiatric/Behavioral: Negative.    Per HPI unless specifically indicated above     Objective:    BP 130/82    Pulse 74   Temp 97.7 F (36.5 C)   Ht  (1.778 m)   Wt 227 lb 14.4 oz (103.4 kg)   SpO2 99%   BMI 32.70 kg/m   Wt Readings from Last 3 Encounters:  05/25/17 227 lb 14.4 oz (103.4 kg)  07/22/16 227 lb 12.8 oz (103.3 kg)  05/22/16 225 lb 12.8 oz (102.4 kg)    Physical Exam  Constitutional: He is oriented to person, place, and time. He appears well-developed and well-nourished. No distress.  HENT:  Head: Atraumatic.  Eyes: Pupils are equal, round, and reactive to light. Conjunctivae are normal. No scleral icterus.  Neck: Normal range of motion. Neck supple.  Cardiovascular: Normal rate and normal heart sounds.   Pulmonary/Chest: Effort normal. No respiratory distress.  Musculoskeletal: Normal range of motion.  Neurological: He is alert and oriented to person, place, and time.  Skin: Skin is warm and dry.  Psychiatric: He has a normal mood and affect. His behavior is normal.  Nursing note and vitals reviewed.     Assessment & Plan:   Problem List Items Addressed This Visit      Cardiovascular and Mediastinum   Hypertension - Primary    Doing well on benazepril, continue current regimen and occasional home monitoring. Refused BMP today, discussed with pt that it would have  to be drawn at next visit in 6 months to assess kidney function.       Relevant Medications   vardenafil (LEVITRA) 10 MG tablet   benazepril (LOTENSIN) 20 MG tablet     Respiratory   Allergic rhinitis    Stable. Continue zyrtec and flonase regimen.         Genitourinary   ED (erectile dysfunction)    Doing well with prn levitra. Continue current regimen        Other   Depression    Stable on effexor. Continue current regimen      Relevant Medications   venlafaxine XR (EFFEXOR-XR) 150 MG 24 hr capsule       Follow up plan: Return in about 6 months (around 11/23/2017) for BP f/u, BMP.

## 2017-05-25 NOTE — Assessment & Plan Note (Signed)
Stable. Continue zyrtec and flonase regimen.

## 2017-11-23 ENCOUNTER — Ambulatory Visit: Payer: Self-pay | Admitting: Family Medicine

## 2017-12-22 ENCOUNTER — Telehealth: Payer: Self-pay | Admitting: Family Medicine

## 2017-12-22 NOTE — Telephone Encounter (Signed)
Copied from CRM [id number]. Topic: Quick Communication - Rx Refill/Question >> Dec 22, 2017  4:21 PM Cipriano Bunker wrote: Medication:   venlafaxine XR (EFFEXOR-XR) 150 MG 24 hr capsule benazepril (LOTENSIN) 20 MG tablet  He is needing a prescription refill - his insurance is new and is [facility] health care - they require him to go to their providers and asking for one month supply until he can get into other doctor  Has the patient contacted their pharmacy? No. (Agent: If no, request that the patient contact the pharmacy for the refill.) Preferred Pharmacy (with phone number or street name):   [facility] - [place], [place] - [address] [place] [address] Phone: [phone] Fax: [phone]   Agent: Please be advised that RX refills may take up to 3 business days. We ask that you follow-up with your pharmacy.

## 2017-12-23 MED ORDER — BENAZEPRIL HCL 20 MG PO TABS: 20.0000 mg | ORAL_TABLET | Freq: Every day | ORAL | 3 refills | Status: DC

## 2017-12-23 MED ORDER — VENLAFAXINE HCL ER 150 MG PO CP24: 150.0000 mg | ORAL_CAPSULE | Freq: Every day | ORAL | 3 refills | Status: DC

## 2018-01-18 DIAGNOSIS — E669 Obesity, unspecified: Secondary | ICD-10-CM | POA: Insufficient documentation

## 2018-09-16 ENCOUNTER — Ambulatory Visit: Payer: BLUE CROSS/BLUE SHIELD | Admitting: Family Medicine

## 2018-09-16 ENCOUNTER — Encounter: Payer: Self-pay | Admitting: Family Medicine

## 2018-09-16 ENCOUNTER — Other Ambulatory Visit: Payer: Self-pay

## 2018-09-16 DIAGNOSIS — F3342 Major depressive disorder, recurrent, in full remission: Secondary | ICD-10-CM

## 2018-09-16 DIAGNOSIS — M79662 Pain in left lower leg: Secondary | ICD-10-CM

## 2018-09-16 DIAGNOSIS — M79661 Pain in right lower leg: Secondary | ICD-10-CM | POA: Insufficient documentation

## 2018-09-16 DIAGNOSIS — I10 Essential (primary) hypertension: Secondary | ICD-10-CM | POA: Diagnosis not present

## 2018-09-16 MED ORDER — BENAZEPRIL HCL 20 MG PO TABS: 20.0000 mg | ORAL_TABLET | Freq: Every day | ORAL | 3 refills | Status: DC

## 2018-09-16 MED ORDER — FLUOXETINE HCL 20 MG PO CAPS: 20.0000 mg | ORAL_CAPSULE | Freq: Every day | ORAL | 3 refills | Status: DC

## 2018-09-16 MED ORDER — TRIAMCINOLONE ACETONIDE 0.1 % EX CREA: 1.0000 "application " | TOPICAL_CREAM | Freq: Two times a day (BID) | CUTANEOUS | 0 refills | Status: DC

## 2018-09-16 MED ORDER — VENLAFAXINE HCL ER 75 MG PO CP24: 75.0000 mg | ORAL_CAPSULE | Freq: Every day | ORAL | 0 refills | Status: DC

## 2018-09-16 NOTE — Assessment & Plan Note (Addendum)
Discussed depression may be playing a role in his legs and poop out syndrome. Will discontinue venlafaxine by tapering to 75 mg for 2 weeks then stopping. Start fluoxetine 20 mg 1 a day tomorrow.

## 2018-09-16 NOTE — Assessment & Plan Note (Signed)
Nonspecific calf pain will change antidepressants see if that makes any difference if continues to be a problem will need to recheck

## 2018-09-16 NOTE — Assessment & Plan Note (Signed)
The current medical regimen is effective;  continue present plan and medications.  

## 2018-09-16 NOTE — Progress Notes (Signed)
BP 116/81 (BP Location: Left Arm, Patient Position: Sitting, Cuff Size: Large)   Pulse 60   Temp 98.9 F (37.2 C) (Oral)   Ht 5' 10.08" (1.78 m)   Wt 230 lb 8 oz (104.6 kg)   SpO2 98%   BMI 33.00 kg/m    Subjective:    Patient ID: Peter Bennett, male    DOB: [birth year], 58 y.o.   MRN: [id number]  HPI: Peter Bennett is a 58 y.o. male  Chief Complaint  Patient presents with  . Hypertension  . Medication Refill  Patient follow-up blood pressure doing well no complaints taking medication without problems or issues. Patient primarily concerned about calf muscles in his legs are bothersome no cramping with activity no nighttime cramping.  Just sometimes feels heavy and tight.  Not taking any other medication that on medication list. Is wondering if the Effexor which she is been on 150 mg for years is starting to poop out which may be a possibility.   Relevant past medical, surgical, family and social history reviewed and updated as indicated. Interim medical history since our last visit reviewed. Allergies and medications reviewed and updated.  Review of Systems  Constitutional: Negative.   Respiratory: Negative.   Cardiovascular: Negative.     Per HPI unless specifically indicated above     Objective:    BP 116/81 (BP Location: Left Arm, Patient Position: Sitting, Cuff Size: Large)   Pulse 60   Temp 98.9 F (37.2 C) (Oral)   Ht 5' 10.08" (1.78 m)   Wt 230 lb 8 oz (104.6 kg)   SpO2 98%   BMI 33.00 kg/m   Wt Readings from Last 3 Encounters:  09/16/18 230 lb 8 oz (104.6 kg)  05/25/17 227 lb 14.4 oz (103.4 kg)  07/22/16 227 lb 12.8 oz (103.3 kg)    Physical Exam Constitutional:      Appearance: He is well-developed.  HENT:     Head: Normocephalic and atraumatic.  Eyes:     Conjunctiva/sclera: Conjunctivae normal.  Neck:     Musculoskeletal: Normal range of motion.  Cardiovascular:     Rate and Rhythm: Normal rate and regular rhythm.     Heart sounds:  Normal heart sounds.  Pulmonary:     Effort: Pulmonary effort is normal.     Breath sounds: Normal breath sounds.  Musculoskeletal: Normal range of motion.        General: No swelling, tenderness, deformity or signs of injury.     Right lower leg: No edema.     Left lower leg: No edema.  Skin:    Findings: No erythema.  Neurological:     Mental Status: He is alert and oriented to person, place, and time.  Psychiatric:        Behavior: Behavior normal.        Thought Content: Thought content normal.        Judgment: Judgment normal.     Results for orders placed or performed in visit on 05/22/16  Basic metabolic panel  Result Value Ref Range   Glucose 68 65 - 99 mg/dL   BUN 16 6 - 24 mg/dL   Creatinine, Ser 1.190.82 0.76 - 1.27 mg/dL   GFR calc non Af Amer 99 >59 mL/min/1.73   GFR calc Af Amer 114 >59 mL/min/1.73   BUN/Creatinine Ratio 20 9 - 20   Sodium 139 134 - 144 mmol/L   Potassium 4.4 3.5 - 5.2 mmol/L   Chloride 99 96 -  106 mmol/L   CO2 27 18 - 29 mmol/L   Calcium 9.4 8.7 - 10.2 mg/dL      Assessment & Plan:   Problem List Items Addressed This Visit      Cardiovascular and Mediastinum   Hypertension    The current medical regimen is effective;  continue present plan and medications.       Relevant Medications   benazepril (LOTENSIN) 20 MG tablet     Other   Depression    Discussed depression may be playing a role in his legs and poop out syndrome. Will discontinue venlafaxine by tapering to 75 mg for 2 weeks then stopping. Start fluoxetine 20 mg 1 a day tomorrow.      Relevant Medications   venlafaxine XR (EFFEXOR-XR) 75 MG 24 hr capsule   FLUoxetine (PROZAC) 20 MG capsule   Bilateral calf pain    Nonspecific calf pain will change antidepressants see if that makes any difference if continues to be a problem will need to recheck          Follow up plan: Return in about 4 weeks (around 10/14/2018).

## 2018-10-05 ENCOUNTER — Ambulatory Visit: Payer: BLUE CROSS/BLUE SHIELD | Admitting: Unknown Physician Specialty

## 2018-10-20 ENCOUNTER — Encounter: Payer: Self-pay | Admitting: Family Medicine

## 2018-10-20 ENCOUNTER — Ambulatory Visit: Payer: BLUE CROSS/BLUE SHIELD | Admitting: Family Medicine

## 2018-10-20 DIAGNOSIS — F3342 Major depressive disorder, recurrent, in full remission: Secondary | ICD-10-CM

## 2018-10-20 DIAGNOSIS — I10 Essential (primary) hypertension: Secondary | ICD-10-CM

## 2018-10-20 DIAGNOSIS — M79662 Pain in left lower leg: Secondary | ICD-10-CM | POA: Diagnosis not present

## 2018-10-20 DIAGNOSIS — M79661 Pain in right lower leg: Secondary | ICD-10-CM | POA: Diagnosis not present

## 2018-10-20 MED ORDER — FLUOXETINE HCL 10 MG PO CAPS: 10.0000 mg | ORAL_CAPSULE | Freq: Three times a day (TID) | ORAL | 3 refills | Status: DC

## 2018-10-20 NOTE — Assessment & Plan Note (Signed)
Better with med change

## 2018-10-20 NOTE — Progress Notes (Signed)
BP 125/83   Pulse 75   Temp 98.3 F (36.8 C) (Oral)   Wt 232 lb 3.2 oz (105.3 kg)   SpO2 97%   BMI 33.24 kg/m    Subjective:    Patient ID: Peter Bennett, male    DOB: [birth year], 58 y.o.   MRN: [id number]  HPI: Peter Bennett is a 58 y.o. male  Chief Complaint  Patient presents with  . Depression  Patient stopped venlafaxine without problems and started fluoxetine 20 mg a day without problems has noticed some improvement in his depression and nerves with feeling better more energy. Is also noticed his legs have quit bothering him as much and are doing better. Patient is also wondering if 30 mg is working little better than 20 so we will try 30 mg and see. Blood pressure doing well in spite of a lot of stress. Relevant past medical, surgical, family and social history reviewed and updated as indicated. Interim medical history since our last visit reviewed. Allergies and medications reviewed and updated.  Review of Systems  Constitutional: Negative.   Respiratory: Negative.   Cardiovascular: Negative.     Per HPI unless specifically indicated above     Objective:    BP 125/83   Pulse 75   Temp 98.3 F (36.8 C) (Oral)   Wt 232 lb 3.2 oz (105.3 kg)   SpO2 97%   BMI 33.24 kg/m   Wt Readings from Last 3 Encounters:  10/20/18 232 lb 3.2 oz (105.3 kg)  09/16/18 230 lb 8 oz (104.6 kg)  05/25/17 227 lb 14.4 oz (103.4 kg)    Physical Exam Constitutional:      Appearance: He is well-developed.  HENT:     Head: Normocephalic and atraumatic.  Eyes:     Conjunctiva/sclera: Conjunctivae normal.  Neck:     Musculoskeletal: Normal range of motion.  Cardiovascular:     Rate and Rhythm: Normal rate and regular rhythm.     Heart sounds: Normal heart sounds.  Pulmonary:     Effort: Pulmonary effort is normal.     Breath sounds: Normal breath sounds.  Musculoskeletal: Normal range of motion.  Skin:    Findings: No erythema.  Neurological:     Mental Status: He  is alert and oriented to person, place, and time.  Psychiatric:        Behavior: Behavior normal.        Thought Content: Thought content normal.        Judgment: Judgment normal.     Results for orders placed or performed in visit on 05/22/16  Basic metabolic panel  Result Value Ref Range   Glucose 68 65 - 99 mg/dL   BUN 16 6 - 24 mg/dL   Creatinine, Ser 6.65 0.76 - 1.27 mg/dL   GFR calc non Af Amer 99 >59 mL/min/1.73   GFR calc Af Amer 114 >59 mL/min/1.73   BUN/Creatinine Ratio 20 9 - 20   Sodium 139 134 - 144 mmol/L   Potassium 4.4 3.5 - 5.2 mmol/L   Chloride 99 96 - 106 mmol/L   CO2 27 18 - 29 mmol/L   Calcium 9.4 8.7 - 10.2 mg/dL      Assessment & Plan:   Problem List Items Addressed This Visit      Cardiovascular and Mediastinum   Hypertension    The current medical regimen is effective;  continue present plan and medications.         Other  Depression    Discussed increasing medications will stick with 30 mg      Relevant Medications   FLUoxetine (PROZAC) 10 MG capsule   Bilateral calf pain    Better with med change          Follow up plan: Return in about 3 months (around 01/20/2019) for Physical Exam.

## 2018-10-20 NOTE — Assessment & Plan Note (Signed)
The current medical regimen is effective;  continue present plan and medications.  

## 2018-10-20 NOTE — Assessment & Plan Note (Signed)
Discussed increasing medications will stick with 30 mg

## 2018-11-25 ENCOUNTER — Other Ambulatory Visit: Payer: Self-pay

## 2018-11-25 ENCOUNTER — Encounter: Payer: Self-pay | Admitting: Family Medicine

## 2018-11-25 ENCOUNTER — Ambulatory Visit (INDEPENDENT_AMBULATORY_CARE_PROVIDER_SITE_OTHER): Payer: BLUE CROSS/BLUE SHIELD | Admitting: Family Medicine

## 2018-11-25 DIAGNOSIS — F339 Major depressive disorder, recurrent, unspecified: Secondary | ICD-10-CM

## 2018-11-25 DIAGNOSIS — G4733 Obstructive sleep apnea (adult) (pediatric): Secondary | ICD-10-CM

## 2018-11-25 DIAGNOSIS — I10 Essential (primary) hypertension: Secondary | ICD-10-CM | POA: Diagnosis not present

## 2018-11-25 MED ORDER — BENAZEPRIL HCL 20 MG PO TABS: 20.0000 mg | ORAL_TABLET | Freq: Every day | ORAL | 5 refills | Status: DC

## 2018-11-25 MED ORDER — FLUOXETINE HCL 10 MG PO CAPS: 10.0000 mg | ORAL_CAPSULE | Freq: Three times a day (TID) | ORAL | 5 refills | Status: DC

## 2018-11-25 NOTE — Assessment & Plan Note (Signed)
Discuss history of sleep apnea patient not using his CPAP and is doing well no daytime drowsiness will observe symptoms

## 2018-11-25 NOTE — Assessment & Plan Note (Signed)
The current medical regimen is effective;  continue present plan and medications.  

## 2018-11-25 NOTE — Progress Notes (Signed)
BP 127/80   Pulse 78   Temp 98.5 F (36.9 C) (Oral)   Wt 227 lb (103 kg)   BMI 32.50 kg/m    Subjective:    Patient ID: Peter Bennett, male    DOB: [birth year], 58 y.o.   MRN: [id number]  HPI: Peter OhClinton E Seats is a 58 y.o. male  Chief Complaint  Patient presents with  . Follow-up  . Hypertension  Telemedicine using audio/video telecommunications for a synchronous communication visit. Today's visit due to COVID-19 isolation precautions I connected with and verified that I am speaking with the correct person using two identifiers.   I discussed the limitations, risks, security and privacy concerns of performing an evaluation and management service by telecommunication and the availability of in person appointments. I also discussed with the patient that there may be a patient responsible charge related to this service. The patient expressed understanding and agreed to proceed. The patient's location is I am at home.   Patient follow-up hypertension doing well with his medications and good control.  Taking medications faithfully without problems. Depression stable on fluoxetine 30 mg a day no problems or issues with medications and resolution of depression. Patient does have some knee and hip flexor pain is been to orthopedics prescribe meloxicam discussed use of meloxicam and occasional holidays.  Relevant past medical, surgical, family and social history reviewed and updated as indicated. Interim medical history since our last visit reviewed. Allergies and medications reviewed and updated.  Review of Systems  Constitutional: Negative.   Respiratory: Negative.   Cardiovascular: Negative.     Per HPI unless specifically indicated above     Objective:    BP 127/80   Pulse 78   Temp 98.5 F (36.9 C) (Oral)   Wt 227 lb (103 kg)   BMI 32.50 kg/m   Wt Readings from Last 3 Encounters:  11/25/18 227 lb (103 kg)  10/20/18 232 lb 3.2 oz (105.3 kg)  09/16/18 230 lb 8  oz (104.6 kg)    Physical Exam  Results for orders placed or performed in visit on 05/22/16  Basic metabolic panel  Result Value Ref Range   Glucose 68 65 - 99 mg/dL   BUN 16 6 - 24 mg/dL   Creatinine, Ser 4.090.82 0.76 - 1.27 mg/dL   GFR calc non Af Amer 99 >59 mL/min/1.73   GFR calc Af Amer 114 >59 mL/min/1.73   BUN/Creatinine Ratio 20 9 - 20   Sodium 139 134 - 144 mmol/L   Potassium 4.4 3.5 - 5.2 mmol/L   Chloride 99 96 - 106 mmol/L   CO2 27 18 - 29 mmol/L   Calcium 9.4 8.7 - 10.2 mg/dL      Assessment & Plan:   Problem List Items Addressed This Visit      Cardiovascular and Mediastinum   Hypertension    The current medical regimen is effective;  continue present plan and medications.         Respiratory   Sleep apnea    Discuss history of sleep apnea patient not using his CPAP and is doing well no daytime drowsiness will observe symptoms        Other   Depression, recurrent (HCC)    The current medical regimen is effective;  continue present plan and medications.         I discussed the assessment and treatment plan with the patient. The patient was provided an opportunity to ask questions and all were answered. The  patient agreed with the plan and demonstrated an understanding of the instructions.   The patient was advised to call back or seek an in-person evaluation if the symptoms worsen or if the condition fails to improve as anticipated.   I provided 21+ minutes of time during this encounter. Hip arthritis discussion Follow up plan: Return in about 3 months (around 02/24/2019) for Physical Exam.

## 2019-01-27 ENCOUNTER — Other Ambulatory Visit: Payer: Self-pay

## 2019-01-27 ENCOUNTER — Encounter: Payer: Self-pay | Admitting: Family Medicine

## 2019-01-27 ENCOUNTER — Ambulatory Visit (INDEPENDENT_AMBULATORY_CARE_PROVIDER_SITE_OTHER): Payer: BLUE CROSS/BLUE SHIELD | Admitting: Family Medicine

## 2019-01-27 DIAGNOSIS — E781 Pure hyperglyceridemia: Secondary | ICD-10-CM | POA: Diagnosis not present

## 2019-01-27 DIAGNOSIS — F339 Major depressive disorder, recurrent, unspecified: Secondary | ICD-10-CM

## 2019-01-27 DIAGNOSIS — Z0000 Encounter for general adult medical examination without abnormal findings: Secondary | ICD-10-CM | POA: Diagnosis not present

## 2019-01-27 DIAGNOSIS — I10 Essential (primary) hypertension: Secondary | ICD-10-CM

## 2019-01-27 MED ORDER — FLUOXETINE HCL 10 MG PO CAPS: 10.0000 mg | ORAL_CAPSULE | Freq: Three times a day (TID) | ORAL | 4 refills | Status: DC

## 2019-01-27 MED ORDER — TRIAMCINOLONE ACETONIDE 0.1 % EX CREA: 1.0000 "application " | TOPICAL_CREAM | Freq: Two times a day (BID) | CUTANEOUS | 0 refills | Status: DC

## 2019-01-27 MED ORDER — BENAZEPRIL HCL 20 MG PO TABS: 20.0000 mg | ORAL_TABLET | Freq: Every day | ORAL | 4 refills | Status: DC

## 2019-01-27 NOTE — Assessment & Plan Note (Signed)
The current medical regimen is effective;  continue present plan and medications.  

## 2019-01-27 NOTE — Progress Notes (Signed)
   There were no vitals taken for this visit.   Subjective:    Patient ID: Peter Bennett, male    DOB: August 05, 1961, 58 y.o.   MRN: [id number]  HPI: Peter Bennett is a 58 y.o. male  Med check Discussed with patient doing well no complaints nerves doing okay as best they can do with mother and hospice. Blood pressure doing well without problems or concerns. Needing some more triamcinolone because he has some poison ivy.    Relevant past medical, surgical, family and social history reviewed and updated as indicated. Interim medical history since our last visit reviewed. Allergies and medications reviewed and updated.  Review of Systems  Constitutional: Negative.   HENT: Negative.   Eyes: Negative.   Respiratory: Negative.   Cardiovascular: Negative.   Gastrointestinal: Negative.   Endocrine: Negative.   Genitourinary: Negative.   Musculoskeletal: Negative.   Skin: Negative.   Allergic/Immunologic: Negative.   Neurological: Negative.   Hematological: Negative.   Psychiatric/Behavioral: Negative.     Per HPI unless specifically indicated above     Objective:    There were no vitals taken for this visit.  Wt Readings from Last 3 Encounters:  11/25/18 227 lb (103 kg)  10/20/18 232 lb 3.2 oz (105.3 kg)  09/16/18 230 lb 8 oz (104.6 kg)    Physical Exam  Results for orders placed or performed in visit on 42/35/36  Basic metabolic panel  Result Value Ref Range   Glucose 68 65 - 99 mg/dL   BUN 16 6 - 24 mg/dL   Creatinine, Ser 0.82 0.76 - 1.27 mg/dL   GFR calc non Af Amer 99 >59 mL/min/1.73   GFR calc Af Amer 114 >59 mL/min/1.73   BUN/Creatinine Ratio 20 9 - 20   Sodium 139 134 - 144 mmol/L   Potassium 4.4 3.5 - 5.2 mmol/L   Chloride 99 96 - 106 mmol/L   CO2 27 18 - 29 mmol/L   Calcium 9.4 8.7 - 10.2 mg/dL      Assessment & Plan:   Problem List Items Addressed This Visit      Cardiovascular and Mediastinum   Hypertension    The current medical regimen  is effective;  continue present plan and medications.       Relevant Medications   benazepril (LOTENSIN) 20 MG tablet     Other   Depression, recurrent (HCC)    The current medical regimen is effective;  continue present plan and medications.       Relevant Medications   FLUoxetine (PROZAC) 10 MG capsule   Hypertriglyceridemia    The current medical regimen is effective;  continue present plan and medications.       Relevant Medications   benazepril (LOTENSIN) 20 MG tablet    Other Visit Diagnoses    PE (physical exam), annual    -  Primary   Relevant Orders   Comprehensive metabolic panel   Lipid panel   CBC with Differential/Platelet   TSH   PSA   Urinalysis, Routine w reflex microscopic    Will come into the office for hands-on physical  Follow up plan: Return for Physical Exam soon and 6 mo f/u.

## 2019-02-07 ENCOUNTER — Other Ambulatory Visit: Payer: BLUE CROSS/BLUE SHIELD

## 2019-02-07 ENCOUNTER — Other Ambulatory Visit: Payer: Self-pay

## 2019-02-07 DIAGNOSIS — Z0000 Encounter for general adult medical examination without abnormal findings: Secondary | ICD-10-CM

## 2019-02-07 LAB — URINALYSIS, ROUTINE W REFLEX MICROSCOPIC
Bilirubin, UA: NEGATIVE
Glucose, UA: NEGATIVE
Ketones, UA: NEGATIVE
Leukocytes,UA: NEGATIVE
Nitrite, UA: NEGATIVE
Protein,UA: NEGATIVE
RBC, UA: NEGATIVE
Specific Gravity, UA: >1.03 — ABNORMAL HIGH (ref 1.005–1.030)
Urobilinogen, Ur: 0.2 mg/dL (ref 0.2–1.0)
pH, UA: 5 (ref 5.0–7.5)

## 2019-02-08 ENCOUNTER — Encounter: Payer: Self-pay | Admitting: Family Medicine

## 2019-02-08 LAB — CBC WITH DIFFERENTIAL/PLATELET
Basophils Absolute: 0.1 10*3/uL (ref 0.0–0.2)
Basos: 2 %
EOS (ABSOLUTE): 0.4 10*3/uL (ref 0.0–0.4)
Eos: 7 %
Hematocrit: 43.3 % (ref 37.5–51.0)
Hemoglobin: 14.4 g/dL (ref 13.0–17.7)
Immature Grans (Abs): 0 10*3/uL (ref 0.0–0.1)
Immature Granulocytes: 0 %
Lymphocytes Absolute: 1.9 10*3/uL (ref 0.7–3.1)
Lymphs: 35 %
MCH: 30.1 pg (ref 26.6–33.0)
MCHC: 33.3 g/dL (ref 31.5–35.7)
MCV: 90 fL (ref 79–97)
Monocytes Absolute: 0.4 10*3/uL (ref 0.1–0.9)
Monocytes: 8 %
Neutrophils Absolute: 2.7 10*3/uL (ref 1.4–7.0)
Neutrophils: 48 %
Platelets: 202 10*3/uL (ref 150–450)
RBC: 4.79 x10E6/uL (ref 4.14–5.80)
RDW: 13.3 % (ref 11.6–15.4)
WBC: 5.5 10*3/uL (ref 3.4–10.8)

## 2019-02-08 LAB — LIPID PANEL
Chol/HDL Ratio: 7 ratio — ABNORMAL HIGH (ref 0.0–5.0)
Cholesterol, Total: 211 mg/dL — ABNORMAL HIGH (ref 100–199)
HDL: 30 mg/dL — ABNORMAL LOW (ref >39)
LDL Calculated: 110 mg/dL — ABNORMAL HIGH (ref 0–99)
Triglycerides: 353 mg/dL — ABNORMAL HIGH (ref 0–149)
VLDL Cholesterol Cal: 71 mg/dL — ABNORMAL HIGH (ref 5–40)

## 2019-02-08 LAB — COMPREHENSIVE METABOLIC PANEL
ALT: 25 IU/L (ref 0–44)
AST: 22 IU/L (ref 0–40)
Albumin/Globulin Ratio: 1.9 (ref 1.2–2.2)
Albumin: 4.6 g/dL (ref 3.8–4.9)
Alkaline Phosphatase: 67 IU/L (ref 39–117)
BUN/Creatinine Ratio: 18 (ref 9–20)
BUN: 15 mg/dL (ref 6–24)
Bilirubin Total: 0.3 mg/dL (ref 0.0–1.2)
CO2: 24 mmol/L (ref 20–29)
Calcium: 9.4 mg/dL (ref 8.7–10.2)
Chloride: 102 mmol/L (ref 96–106)
Creatinine, Ser: 0.85 mg/dL (ref 0.76–1.27)
GFR calc Af Amer: 110 mL/min/{1.73_m2} (ref >59)
GFR calc non Af Amer: 95 mL/min/{1.73_m2} (ref >59)
Globulin, Total: 2.4 g/dL (ref 1.5–4.5)
Glucose: 89 mg/dL (ref 65–99)
Potassium: 4.6 mmol/L (ref 3.5–5.2)
Sodium: 140 mmol/L (ref 134–144)
Total Protein: 7 g/dL (ref 6.0–8.5)

## 2019-02-08 LAB — PSA: Prostate Specific Ag, Serum: 1.7 ng/mL (ref 0.0–4.0)

## 2019-02-08 LAB — TSH: TSH: 3.77 u[IU]/mL (ref 0.450–4.500)

## 2019-04-12 ENCOUNTER — Encounter: Payer: BLUE CROSS/BLUE SHIELD | Admitting: Nurse Practitioner

## 2019-05-10 ENCOUNTER — Encounter: Payer: BLUE CROSS/BLUE SHIELD | Admitting: Nurse Practitioner

## 2019-07-28 ENCOUNTER — Encounter: Payer: Self-pay | Admitting: Family Medicine

## 2019-07-28 ENCOUNTER — Ambulatory Visit (INDEPENDENT_AMBULATORY_CARE_PROVIDER_SITE_OTHER): Payer: BLUE CROSS/BLUE SHIELD | Admitting: Nurse Practitioner

## 2019-07-28 ENCOUNTER — Encounter: Payer: Self-pay | Admitting: Nurse Practitioner

## 2019-07-28 ENCOUNTER — Other Ambulatory Visit: Payer: Self-pay

## 2019-07-28 DIAGNOSIS — M1990 Unspecified osteoarthritis, unspecified site: Secondary | ICD-10-CM | POA: Insufficient documentation

## 2019-07-28 DIAGNOSIS — I10 Essential (primary) hypertension: Secondary | ICD-10-CM

## 2019-07-28 DIAGNOSIS — E781 Pure hyperglyceridemia: Secondary | ICD-10-CM | POA: Diagnosis not present

## 2019-07-28 DIAGNOSIS — F339 Major depressive disorder, recurrent, unspecified: Secondary | ICD-10-CM | POA: Diagnosis not present

## 2019-07-28 MED ORDER — FLUOXETINE HCL 10 MG PO CAPS: 10.0000 mg | ORAL_CAPSULE | Freq: Three times a day (TID) | ORAL | 3 refills | Status: DC

## 2019-07-28 MED ORDER — BENAZEPRIL HCL 20 MG PO TABS: 20.0000 mg | ORAL_TABLET | Freq: Every day | ORAL | 4 refills | Status: DC

## 2019-07-28 NOTE — Assessment & Plan Note (Signed)
Chronic, stable.  Denies SI/HI.  Continue current medication regimen and adjust as needed, refills sent.  Plan for face to face visit in 6 months.

## 2019-07-28 NOTE — Assessment & Plan Note (Signed)
Chronic, ongoing with recent June labs noting ASCVD 15.8%, may benefit from addition of statin.  Will continue current regimen at this time and plan on seeing patient in 6 months for face to face exam and repeat lab visit.

## 2019-07-28 NOTE — Progress Notes (Signed)
LVM to make annual physical on or after 01/27/2020 ,sent letter.

## 2019-07-28 NOTE — Assessment & Plan Note (Signed)
Chronic, stable with BP at goal at home.  Continue current medication regimen and adjust as needed.  Refills sent.  Plan for follow-up in office in 6 months.

## 2019-07-28 NOTE — Progress Notes (Signed)
BP 132/81   Pulse 62   Temp (!) 96.7 F (35.9 C) (Oral)   SpO2 98%    Subjective:    Patient ID: Peter Bennett, male    DOB: 11/10/1960, 58 y.o.   MRN: [id number]  HPI: Peter OhClinton E Halliwell is a 58 y.o. male  Chief Complaint  Patient presents with  . Depression  . Hypertension    . This visit was completed via Doximity due to the restrictions of the COVID-19 pandemic. All issues as above were discussed and addressed. Physical exam was done as above through visual confirmation on Doximity. If it was felt that the patient should be evaluated in the office, they were directed there. The patient verbally consented to this visit. . Location of the patient: home . Location of the provider: home . Those involved with this call:  . Provider: Aura DialsJolene Yogesh Cominsky, DNP . CMA: Elton SinAnita Quito, CMA . Front Desk/Registration: Adela Portshristan Williamson  . Time spent on call: 15 minutes with patient face to face via video conference. More than 50% of this time was spent in counseling and coordination of care. 10 minutes total spent in review of patient's record and preparation of their chart.  . I verified patient identity using two factors (patient name and date of birth). Patient consents verbally to being seen via telemedicine visit today.    DEPRESSION Continues on Prozac 10 MG daily.  Previously on Effexor, which he did not feel like it was working.   Mood status: stable Satisfied with current treatment?: yes Symptom severity: mild  Duration of current treatment : chronic Side effects: no Medication compliance: good compliance Psychotherapy/counseling: none Previous psychiatric medications: Effexor Depressed mood: no Anxious mood: no Anhedonia: no Significant weight loss or gain: no Insomnia: none Fatigue: yes Feelings of worthlessness or guilt: no Impaired concentration/indecisiveness: no Suicidal ideations: no Hopelessness: no Crying spells: no Depression screen [facility] 2[facility] 07/28/2019  10/20/2018 05/22/2016 04/16/2015  Decreased Interest 0 0 0 1  Down, Depressed, Hopeless 0 1 0 0  PHQ - 2 Score 0 1 0 1  Altered sleeping 0 2 1 -  Tired, decreased energy 2 1 2  -  Change in appetite 0 0 0 -  Feeling bad or failure about yourself  0 0 0 -  Trouble concentrating 0 0 1 -  Moving slowly or fidgety/restless 0 2 0 -  Suicidal thoughts 0 0 0 -  PHQ-9 Score 2 6 4  -  Difficult doing work/chores Not difficult at all - - -   HYPERTENSION / HYPERLIPIDEMIA Continues on Benazepril and ASA + Fish Oil.  Has lost 20 lbs since June, changing diet.  Discussed current ASCVD and benefit that may present with statin use, he wishes to think about this and continue diet focus with recheck of labs at next visit.  Has never been on statin. Satisfied with current treatment? yes Duration of hypertension: chronic BP monitoring frequency: weekly BP range: average 130/80 range BP medication side effects: no Duration of hyperlipidemia: chronic Cholesterol medication side effects: no Cholesterol supplements: fish oil Medication compliance: good compliance Aspirin: yes Recent stressors: no Recurrent headaches: no Visual changes: no Palpitations: no Dyspnea: no Chest pain: no Lower extremity edema: no Dizzy/lightheaded: no  The 10-year ASCVD risk score Denman George(Goff DC Jr., et al., 2013) is: 15.8%   Values used to calculate the score:     Age: [age] years     Sex: Male     Is Non-Hispanic African American: No  Diabetic: No     Tobacco smoker: No     Systolic Blood Pressure: 132 mmHg     Is BP treated: Yes     HDL Cholesterol: 30 mg/dL     Total Cholesterol: 211 mg/dL  Relevant past medical, surgical, family and social history reviewed and updated as indicated. Interim medical history since our last visit reviewed. Allergies and medications reviewed and updated.  Review of Systems  Constitutional: Negative for activity change and fatigue.  Respiratory: Negative for cough, chest tightness,  shortness of breath and wheezing.   Cardiovascular: Negative for chest pain, palpitations and leg swelling.  Gastrointestinal: Negative.   Neurological: Negative.   Psychiatric/Behavioral: Negative for decreased concentration, self-injury, sleep disturbance and suicidal ideas. The patient is not nervous/anxious.     Per HPI unless specifically indicated above     Objective:    BP 132/81   Pulse 62   Temp (!) 96.7 F (35.9 C) (Oral)   SpO2 98%   Wt Readings from Last 3 Encounters:  11/25/18 227 lb (103 kg)  10/20/18 232 lb 3.2 oz (105.3 kg)  09/16/18 230 lb 8 oz (104.6 kg)    Physical Exam Vitals and nursing note reviewed.  Constitutional:      General: He is awake. He is not in acute distress.    Appearance: He is well-developed. He is not ill-appearing.  HENT:     Head: Normocephalic.     Right Ear: Hearing normal. No drainage.     Left Ear: Hearing normal. No drainage.  Eyes:     General: Lids are normal.        Right eye: No discharge.        Left eye: No discharge.     Conjunctiva/sclera: Conjunctivae normal.  Pulmonary:     Effort: Pulmonary effort is normal. No accessory muscle usage or respiratory distress.  Musculoskeletal:     Cervical back: Normal range of motion.  Neurological:     Mental Status: He is alert and oriented to person, place, and time.  Psychiatric:        Mood and Affect: Mood normal.        Behavior: Behavior normal. Behavior is cooperative.        Thought Content: Thought content normal.        Judgment: Judgment normal.     Results for orders placed or performed in visit on 02/07/19  Urinalysis, Routine w reflex microscopic  Result Value Ref Range   Specific Gravity, UA >1.030 (H) 1.005 - 1.030   pH, UA 5.0 5.0 - 7.5   Color, UA Yellow Yellow   Appearance Ur Clear Clear   Leukocytes,UA Negative Negative   Protein,UA Negative Negative/Trace   Glucose, UA Negative Negative   Ketones, UA Negative Negative   RBC, UA Negative  Negative   Bilirubin, UA Negative Negative   Urobilinogen, Ur 0.2 0.2 - 1.0 mg/dL   Nitrite, UA Negative Negative  Comprehensive metabolic panel  Result Value Ref Range   Glucose 89 65 - 99 mg/dL   BUN 15 6 - 24 mg/dL   Creatinine, Ser 8.29 0.76 - 1.27 mg/dL   GFR calc non Af Amer 95 >59 mL/min/1.73   GFR calc Af Amer 110 >59 mL/min/1.73   BUN/Creatinine Ratio 18 9 - 20   Sodium 140 134 - 144 mmol/L   Potassium 4.6 3.5 - 5.2 mmol/L   Chloride 102 96 - 106 mmol/L   CO2 24 20 - 29 mmol/L  Calcium 9.4 8.7 - 10.2 mg/dL   Total Protein 7.0 6.0 - 8.5 g/dL   Albumin 4.6 3.8 - 4.9 g/dL   Globulin, Total 2.4 1.5 - 4.5 g/dL   Albumin/Globulin Ratio 1.9 1.2 - 2.2   Bilirubin Total 0.3 0.0 - 1.2 mg/dL   Alkaline Phosphatase 67 39 - 117 IU/L   AST 22 0 - 40 IU/L   ALT 25 0 - 44 IU/L  Lipid panel  Result Value Ref Range   Cholesterol, Total 211 (H) 100 - 199 mg/dL   Triglycerides 353 (H) 0 - 149 mg/dL   HDL 30 (L) >39 mg/dL   VLDL Cholesterol Cal 71 (H) 5 - 40 mg/dL   LDL Calculated 110 (H) 0 - 99 mg/dL   Chol/HDL Ratio 7.0 (H) 0.0 - 5.0 ratio  CBC with Differential/Platelet  Result Value Ref Range   WBC 5.5 3.4 - 10.8 x10E3/uL   RBC 4.79 4.14 - 5.80 x10E6/uL   Hemoglobin 14.4 13.0 - 17.7 g/dL   Hematocrit 43.3 37.5 - 51.0 %   MCV 90 79 - 97 fL   MCH 30.1 26.6 - 33.0 pg   MCHC 33.3 31.5 - 35.7 g/dL   RDW 13.3 11.6 - 15.4 %   Platelets 202 150 - 450 x10E3/uL   Neutrophils 48 Not Estab. %   Lymphs 35 Not Estab. %   Monocytes 8 Not Estab. %   Eos 7 Not Estab. %   Basos 2 Not Estab. %   Neutrophils Absolute 2.7 1.4 - 7.0 x10E3/uL   Lymphocytes Absolute 1.9 0.7 - 3.1 x10E3/uL   Monocytes Absolute 0.4 0.1 - 0.9 x10E3/uL   EOS (ABSOLUTE) 0.4 0.0 - 0.4 x10E3/uL   Basophils Absolute 0.1 0.0 - 0.2 x10E3/uL   Immature Granulocytes 0 Not Estab. %   Immature Grans (Abs) 0.0 0.0 - 0.1 x10E3/uL  TSH  Result Value Ref Range   TSH 3.770 0.450 - 4.500 uIU/mL  PSA  Result Value Ref Range    Prostate Specific Ag, Serum 1.7 0.0 - 4.0 ng/mL      Assessment & Plan:   Problem List Items Addressed This Visit      Cardiovascular and Mediastinum   Hypertension    Chronic, stable with BP at goal at home.  Continue current medication regimen and adjust as needed.  Refills sent.  Plan for follow-up in office in 6 months.      Relevant Medications   benazepril (LOTENSIN) 20 MG tablet     Other   Depression, recurrent (HCC)    Chronic, stable.  Denies SI/HI.  Continue current medication regimen and adjust as needed, refills sent.  Plan for face to face visit in 6 months.      Relevant Medications   FLUoxetine (PROZAC) 10 MG capsule   Hypertriglyceridemia    Chronic, ongoing with recent June labs noting ASCVD 15.8%, may benefit from addition of statin.  Will continue current regimen at this time and plan on seeing patient in 6 months for face to face exam and repeat lab visit.        Relevant Medications   benazepril (LOTENSIN) 20 MG tablet      I discussed the assessment and treatment plan with the patient. The patient was provided an opportunity to ask questions and all were answered. The patient agreed with the plan and demonstrated an understanding of the instructions.   The patient was advised to call back or seek an in-person evaluation if the symptoms worsen or  if the condition fails to improve as anticipated.   I provided 15 minutes of time during this encounter.  Follow up plan: Return in about 6 months (around 01/27/2020) for Annual physical in office with labs.

## 2019-07-28 NOTE — Patient Instructions (Signed)
Living With Depression Everyone experiences occasional disappointment, sadness, and loss in their lives. When you are feeling down, blue, or sad for at least 2 weeks in a row, it may mean that you have depression. Depression can affect your thoughts and feelings, relationships, daily activities, and physical health. It is caused by changes in the way your brain functions. If you receive a diagnosis of depression, your health care provider will tell you which type of depression you have and what treatment options are available to you. If you are living with depression, there are ways to help you recover from it and also ways to prevent it from coming back. How to cope with lifestyle changes Coping with stress     Stress is your body's reaction to life changes and events, both good and bad. Stressful situations may include:  Getting married.  The death of a spouse.  Losing a job.  Retiring.  Having a baby. Stress can last just a few hours or it can be ongoing. Stress can play a major role in depression, so it is important to learn both how to cope with stress and how to think about it differently. Talk with your health care provider or a counselor if you would like to learn more about stress reduction. He or she may suggest some stress reduction techniques, such as:  Music therapy. This can include creating music or listening to music. Choose music that you enjoy and that inspires you.  Mindfulness-based meditation. This kind of meditation can be done while sitting or walking. It involves being aware of your normal breaths, rather than trying to control your breathing.  Centering prayer. This is a kind of meditation that involves focusing on a spiritual word or phrase. Choose a word, phrase, or sacred image that is meaningful to you and that brings you peace.  Deep breathing. To do this, expand your stomach and inhale slowly through your nose. Hold your breath for 3-5 seconds, then exhale  slowly, allowing your stomach muscles to relax.  Muscle relaxation. This involves intentionally tensing muscles then relaxing them. Choose a stress reduction technique that fits your lifestyle and personality. Stress reduction techniques take time and practice to develop. Set aside 5-15 minutes a day to do them. Therapists can offer training in these techniques. The training may be covered by some insurance plans. Other things you can do to manage stress include:  Keeping a stress diary. This can help you learn what triggers your stress and ways to control your response.  Understanding what your limits are and saying no to requests or events that lead to a schedule that is too full.  Thinking about how you respond to certain situations. You may not be able to control everything, but you can control how you react.  Adding humor to your life by watching funny films or TV shows.  Making time for activities that help you relax and not feeling guilty about spending your time this way.  Medicines Your health care provider may suggest certain medicines if he or she feels that they will help improve your condition. Avoid using alcohol and other substances that may prevent your medicines from working properly (may interact). It is also important to:  Talk with your pharmacist or health care provider about all the medicines that you take, their possible side effects, and what medicines are safe to take together.  Make it your goal to take part in all treatment decisions (shared decision-making). This includes giving input on   the side effects of medicines. It is best if shared decision-making with your health care provider is part of your total treatment plan. If your health care provider prescribes a medicine, you may not notice the full benefits of it for 4-8 weeks. Most people who are treated for depression need to be on medicine for at least 6-12 months after they feel better. If you are taking  medicines as part of your treatment, do not stop taking medicines without first talking to your health care provider. You may need to have the medicine slowly decreased (tapered) over time to decrease the risk of harmful side effects. Relationships Your health care provider may suggest family therapy along with individual therapy and drug therapy. While there may not be family problems that are causing you to feel depressed, it is still important to make sure your family learns as much as they can about your mental health. Having your family's support can help make your treatment successful. How to recognize changes in your condition Everyone has a different response to treatment for depression. Recovery from major depression happens when you have not had signs of major depression for two months. This may mean that you will start to:  Have more interest in doing activities.  Feel less hopeless than you did 2 months ago.  Have more energy.  Overeat less often, or have better or improving appetite.  Have better concentration. Your health care provider will work with you to decide the next steps in your recovery. It is also important to recognize when your condition is getting worse. Watch for these signs:  Having fatigue or low energy.  Eating too much or too little.  Sleeping too much or too little.  Feeling restless, agitated, or hopeless.  Having trouble concentrating or making decisions.  Having unexplained physical complaints.  Feeling irritable, angry, or aggressive. Get help as soon as you or your family members notice these symptoms coming back. How to get support and help from others How to talk with friends and family members about your condition  Talking to friends and family members about your condition can provide you with one way to get support and guidance. Reach out to trusted friends or family members, explain your symptoms to them, and let them know that you are  working with a health care provider to treat your depression. Financial resources Not all insurance plans cover mental health care, so it is important to check with your insurance carrier. If paying for co-pays or counseling services is a problem, search for a local or county mental health care center. They may be able to offer public mental health care services at low or no cost when you are not able to see a private health care provider. If you are taking medicine for depression, you may be able to get the generic form, which may be less expensive. Some makers of prescription medicines also offer help to patients who cannot afford the medicines they need. Follow these instructions at home:   Get the right amount and quality of sleep.  Cut down on using caffeine, tobacco, alcohol, and other potentially harmful substances.  Try to exercise, such as walking or lifting small weights.  Take over-the-counter and prescription medicines only as told by your health care provider.  Eat a healthy diet that includes plenty of vegetables, fruits, whole grains, low-fat dairy products, and lean protein. Do not eat a lot of foods that are high in solid fats, added sugars, or salt.    Keep all follow-up visits as told by your health care provider. This is important. Contact a health care provider if:  You stop taking your antidepressant medicines, and you have any of these symptoms: ? Nausea. ? Headache. ? Feeling lightheaded. ? Chills and body aches. ? Not being able to sleep (insomnia).  You or your friends and family think your depression is getting worse. Get help right away if:  You have thoughts of hurting yourself or others. If you ever feel like you may hurt yourself or others, or have thoughts about taking your own life, get help right away. You can go to your nearest emergency department or call:  Your local emergency services (911 in the [place]  A suicide crisis helpline, such as the  National Suicide Prevention Lifeline at 1-800-273-8255. This is open 24-hours a day. Summary  If you are living with depression, there are ways to help you recover from it and also ways to prevent it from coming back.  Work with your health care team to create a management plan that includes counseling, stress management techniques, and healthy lifestyle habits. This information is not intended to replace advice given to you by your health care provider. Make sure you discuss any questions you have with your health care provider. Document Released: 07/07/2016 Document Revised: 11/26/2018 Document Reviewed: 07/07/2016 Elsevier Patient Education  2020 [employer]  

## 2019-07-29 ENCOUNTER — Telehealth: Payer: Self-pay | Admitting: Family Medicine

## 2019-07-29 ENCOUNTER — Other Ambulatory Visit: Payer: Self-pay | Admitting: Nurse Practitioner

## 2019-07-29 MED ORDER — CYCLOBENZAPRINE HCL 10 MG PO TABS: 10.0000 mg | ORAL_TABLET | Freq: Three times a day (TID) | ORAL | 1 refills | Status: DC | PRN

## 2019-07-29 NOTE — Telephone Encounter (Signed)
Sent in

## 2019-07-29 NOTE — Telephone Encounter (Signed)
Pt need a refill on cyclobenzaprine 10 mg . Pt virtual visit with jolene yesterday. [facility] in [place]

## 2020-01-12 ENCOUNTER — Other Ambulatory Visit: Payer: Self-pay

## 2020-01-12 ENCOUNTER — Ambulatory Visit (INDEPENDENT_AMBULATORY_CARE_PROVIDER_SITE_OTHER): Payer: 59 | Admitting: Family Medicine

## 2020-01-12 ENCOUNTER — Encounter: Payer: Self-pay | Admitting: Family Medicine

## 2020-01-12 VITALS — BP 119/76 | HR 73 | Temp 98.0°F | Wt 204.0 lb

## 2020-01-12 DIAGNOSIS — R1031 Right lower quadrant pain: Secondary | ICD-10-CM

## 2020-01-12 LAB — UA/M W/RFLX CULTURE, ROUTINE
Bilirubin, UA: NEGATIVE
Glucose, UA: NEGATIVE
Leukocytes,UA: NEGATIVE
Nitrite, UA: NEGATIVE
Protein,UA: NEGATIVE
RBC, UA: NEGATIVE
Specific Gravity, UA: >1.03 — ABNORMAL HIGH (ref 1.005–1.030)
Urobilinogen, Ur: 0.2 mg/dL (ref 0.2–1.0)
pH, UA: 5.5 (ref 5.0–7.5)

## 2020-01-12 MED ORDER — PREDNISONE 10 MG PO TABS: ORAL_TABLET | ORAL | 0 refills | Status: DC

## 2020-01-12 MED ORDER — CYCLOBENZAPRINE HCL 10 MG PO TABS: 10.0000 mg | ORAL_TABLET | Freq: Three times a day (TID) | ORAL | 1 refills | Status: DC | PRN

## 2020-01-12 NOTE — Progress Notes (Signed)
BP 119/76   Pulse 73   Temp 98 F (36.7 C) (Oral)   Wt 204 lb (92.5 kg)   SpO2 98%   BMI 29.20 kg/m    Subjective:    Patient ID: Peter Bennett, male    DOB: [birth year], 59 y.o.   MRN: [id number]  HPI: Peter Bennett is a 59 y.o. male  Chief Complaint  Patient presents with  . Back Pain    lower right side since Monday  . Abdominal Pain   RLQ abdominal pain that started about 4 days ago, now wraps around side to low back. Certain movements seem to make it worse. Trying heating pad and aleve with minimal relief. Notes prior to onset he was chopping and hauling large bamboo trees but otherwise no new changes. Denies fever, chills, N/V, diarrhea, urinary sxs, hx of kidney stones or UTIs.    Relevant past medical, surgical, family and social history reviewed and updated as indicated. Interim medical history since our last visit reviewed. Allergies and medications reviewed and updated.  Review of Systems  Per HPI unless specifically indicated above     Objective:    BP 119/76   Pulse 73   Temp 98 F (36.7 C) (Oral)   Wt 204 lb (92.5 kg)   SpO2 98%   BMI 29.20 kg/m   Wt Readings from Last 3 Encounters:  01/12/20 204 lb (92.5 kg)  11/25/18 227 lb (103 kg)  10/20/18 232 lb 3.2 oz (105.3 kg)    Physical Exam Vitals and nursing note reviewed.  Constitutional:      Appearance: Normal appearance.  HENT:     Head: Atraumatic.  Eyes:     Extraocular Movements: Extraocular movements intact.     Conjunctiva/sclera: Conjunctivae normal.  Cardiovascular:     Rate and Rhythm: Normal rate and regular rhythm.  Pulmonary:     Effort: Pulmonary effort is normal.     Breath sounds: Normal breath sounds.  Abdominal:     General: Bowel sounds are normal. There is no distension.     Palpations: Abdomen is soft.     Tenderness: There is no abdominal tenderness. There is no right CVA tenderness, left CVA tenderness or guarding.  Musculoskeletal:        General: Normal  range of motion.     Cervical back: Normal range of motion and neck supple.     Comments: Right low back, flank nontender to palpation but pain is reproducible with ROM to resistance of right hip and leg, bending at waist  Skin:    General: Skin is warm and dry.  Neurological:     General: No focal deficit present.     Mental Status: He is oriented to person, place, and time.  Psychiatric:        Mood and Affect: Mood normal.        Thought Content: Thought content normal.        Judgment: Judgment normal.     Results for orders placed or performed in visit on 02/07/19  Urinalysis, Routine w reflex microscopic  Result Value Ref Range   Specific Gravity, UA >1.030 (H) 1.005 - 1.030   pH, UA 5.0 5.0 - 7.5   Color, UA Yellow Yellow   Appearance Ur Clear Clear   Leukocytes,UA Negative Negative   Protein,UA Negative Negative/Trace   Glucose, UA Negative Negative   Ketones, UA Negative Negative   RBC, UA Negative Negative   Bilirubin, UA Negative Negative  Urobilinogen, Ur 0.2 0.2 - 1.0 mg/dL   Nitrite, UA Negative Negative  Comprehensive metabolic panel  Result Value Ref Range   Glucose 89 65 - 99 mg/dL   BUN 15 6 - 24 mg/dL   Creatinine, Ser 0.85 0.76 - 1.27 mg/dL   GFR calc non Af Amer 95 >59 mL/min/1.73   GFR calc Af Amer 110 >59 mL/min/1.73   BUN/Creatinine Ratio 18 9 - 20   Sodium 140 134 - 144 mmol/L   Potassium 4.6 3.5 - 5.2 mmol/L   Chloride 102 96 - 106 mmol/L   CO2 24 20 - 29 mmol/L   Calcium 9.4 8.7 - 10.2 mg/dL   Total Protein 7.0 6.0 - 8.5 g/dL   Albumin 4.6 3.8 - 4.9 g/dL   Globulin, Total 2.4 1.5 - 4.5 g/dL   Albumin/Globulin Ratio 1.9 1.2 - 2.2   Bilirubin Total 0.3 0.0 - 1.2 mg/dL   Alkaline Phosphatase 67 39 - 117 IU/L   AST 22 0 - 40 IU/L   ALT 25 0 - 44 IU/L  Lipid panel  Result Value Ref Range   Cholesterol, Total 211 (H) 100 - 199 mg/dL   Triglycerides 353 (H) 0 - 149 mg/dL   HDL 30 (L) >39 mg/dL   VLDL Cholesterol Cal 71 (H) 5 - 40 mg/dL    LDL Calculated 110 (H) 0 - 99 mg/dL   Chol/HDL Ratio 7.0 (H) 0.0 - 5.0 ratio  CBC with Differential/Platelet  Result Value Ref Range   WBC 5.5 3.4 - 10.8 x10E3/uL   RBC 4.79 4.14 - 5.80 x10E6/uL   Hemoglobin 14.4 13.0 - 17.7 g/dL   Hematocrit 43.3 37.5 - 51.0 %   MCV 90 79 - 97 fL   MCH 30.1 26.6 - 33.0 pg   MCHC 33.3 31.5 - 35.7 g/dL   RDW 13.3 11.6 - 15.4 %   Platelets 202 150 - 450 x10E3/uL   Neutrophils 48 Not Estab. %   Lymphs 35 Not Estab. %   Monocytes 8 Not Estab. %   Eos 7 Not Estab. %   Basos 2 Not Estab. %   Neutrophils Absolute 2.7 1.4 - 7.0 x10E3/uL   Lymphocytes Absolute 1.9 0.7 - 3.1 x10E3/uL   Monocytes Absolute 0.4 0.1 - 0.9 x10E3/uL   EOS (ABSOLUTE) 0.4 0.0 - 0.4 x10E3/uL   Basophils Absolute 0.1 0.0 - 0.2 x10E3/uL   Immature Granulocytes 0 Not Estab. %   Immature Grans (Abs) 0.0 0.0 - 0.1 x10E3/uL  TSH  Result Value Ref Range   TSH 3.770 0.450 - 4.500 uIU/mL  PSA  Result Value Ref Range   Prostate Specific Ag, Serum 1.7 0.0 - 4.0 ng/mL      Assessment & Plan:   Problem List Items Addressed This Visit    None    Visit Diagnoses    Right lower quadrant abdominal pain    -  Primary   U/A vitals benign, exam consistent with muscle strain. Tx with prednisone, flexeril, OTC pain medications and supportive care. F/u if not resolving   Relevant Orders   UA/M w/rflx Culture, Routine       Follow up plan: Return for as scheduled for CPE.

## 2020-01-24 ENCOUNTER — Encounter: Payer: Self-pay | Admitting: Nurse Practitioner

## 2020-01-24 ENCOUNTER — Ambulatory Visit (INDEPENDENT_AMBULATORY_CARE_PROVIDER_SITE_OTHER): Payer: 59 | Admitting: Nurse Practitioner

## 2020-01-24 ENCOUNTER — Other Ambulatory Visit: Payer: Self-pay

## 2020-01-24 VITALS — BP 114/70 | HR 80 | Temp 98.1°F | Wt 206.4 lb

## 2020-01-24 DIAGNOSIS — E781 Pure hyperglyceridemia: Secondary | ICD-10-CM

## 2020-01-24 DIAGNOSIS — Z6830 Body mass index (BMI) 30.0-30.9, adult: Secondary | ICD-10-CM

## 2020-01-24 DIAGNOSIS — G4733 Obstructive sleep apnea (adult) (pediatric): Secondary | ICD-10-CM

## 2020-01-24 DIAGNOSIS — E6609 Other obesity due to excess calories: Secondary | ICD-10-CM

## 2020-01-24 DIAGNOSIS — F339 Major depressive disorder, recurrent, unspecified: Secondary | ICD-10-CM

## 2020-01-24 DIAGNOSIS — Z23 Encounter for immunization: Secondary | ICD-10-CM | POA: Diagnosis not present

## 2020-01-24 DIAGNOSIS — I10 Essential (primary) hypertension: Secondary | ICD-10-CM | POA: Diagnosis not present

## 2020-01-24 MED ORDER — FLUOXETINE HCL 40 MG PO CAPS
40.0000 mg | ORAL_CAPSULE | Freq: Every day | ORAL | 3 refills | Status: DC
Start: 2020-01-24 — End: 2020-09-12

## 2020-01-24 NOTE — Assessment & Plan Note (Signed)
On review of chart has history of OSA, does not wear CPAP (refuses).  Suspect this is underlying reason for some of his fatigue which has been present for > 3 years.  Recommend sleep study and CPAP use, refused.

## 2020-01-24 NOTE — Assessment & Plan Note (Signed)
Recommended eating smaller high protein, low fat meals more frequently and exercising 30 mins a day 5 times a week with a goal of 10-15lb weight loss in the next 3 months. Patient voiced their understanding and motivation to adhere to these recommendations.  

## 2020-01-24 NOTE — Progress Notes (Signed)
BP 114/70   Pulse 80   Temp 98.1 F (36.7 C) (Oral)   Wt 206 lb 6.4 oz (93.6 kg)   SpO2 97%   BMI 29.55 kg/m    Subjective:    Patient ID: Peter Bennett, male    DOB: [birth year], 59 y.o.   MRN: [id number]  HPI: Peter Bennett is a 59 y.o. male  Chief Complaint  Patient presents with  . Depression  . Hypertension  . Hyperlipidemia   DEPRESSION Continues On Prozac 30 MG a day, started on March 2020.  He endorses feeling tired, which has been going on for several years (3 years).  Feels drained every day, like he could stay in the bed.  Does not believe he snores, no HA in the morning.  Sleep pattern varies, sometimes 8 hours and other times less with frequent waking.  Has history of OSA, but refuses to wear CPAP. Mood status: stable Satisfied with current treatment?: yes Symptom severity: moderate  Duration of current treatment : chronic Side effects: no Medication compliance: good compliance Psychotherapy/counseling: none Previous psychiatric medications: Effexor Depressed mood: no Anxious mood: no Anhedonia: no Significant weight loss or gain: no Insomnia: yes hard to stay asleep Fatigue: yes Feelings of worthlessness or guilt: no Impaired concentration/indecisiveness: yes Suicidal ideations: no Hopelessness: no Crying spells: no Depression screen [facility] 2/9 01/24/2020 01/12/2020 07/28/2019 10/20/2018 05/22/2016  Decreased Interest 0 0 0 0 0  Down, Depressed, Hopeless 0 0 0 1 0  PHQ - 2 Score 0 0 0 1 0  Altered sleeping 0 0 0 2 1  Tired, decreased energy 2 3 2 1 2   Change in appetite 0 0 0 0 0  Feeling bad or failure about yourself  0 0 0 0 0  Trouble concentrating 0 0 0 0 1  Moving slowly or fidgety/restless 0 0 0 2 0  Suicidal thoughts 0 0 0 0 0  PHQ-9 Score 2 3 2 6 4   Difficult doing work/chores - - Not difficult at all - -   HYPERTENSION / HYPERLIPIDEMIA Continues ASA and Benazepril + Fish Oil.  June 2020 -- LDL 110 and 211, triglycerides 353. Satisfied  with current treatment? yes Duration of hypertension: chronic BP monitoring frequency: not checking BP range:  BP medication side effects: no Duration of hyperlipidemia: chronic Aspirin: yes Recent stressors: no Recurrent headaches: no Visual changes: no Palpitations: no Dyspnea: no Chest pain: no Lower extremity edema: no Dizzy/lightheaded: no  The 10-year ASCVD risk score DC Jr., et al., 2013) is: 12.4%   Values used to calculate the score:     Age: 59 years     Sex: Male     Is Non-Hispanic African American: No     Diabetic: No     Tobacco smoker: No     Systolic Blood Pressure: 114 mmHg     Is BP treated: Yes     HDL Cholesterol: 30 mg/dL     Total Cholesterol: 211 mg/dL   Relevant past medical, surgical, family and social history reviewed and updated as indicated. Interim medical history since our last visit reviewed. Allergies and medications reviewed and updated.  Review of Systems  Constitutional: Positive for fatigue. Negative for activity change, diaphoresis and fever.  Respiratory: Negative for cough, chest tightness, shortness of breath and wheezing.   Cardiovascular: Negative for chest pain, palpitations and leg swelling.  Gastrointestinal: Negative.   Neurological: Negative.   Psychiatric/Behavioral: Negative.     Per HPI unless specifically  indicated above     Objective:    BP 114/70   Pulse 80   Temp 98.1 F (36.7 C) (Oral)   Wt 206 lb 6.4 oz (93.6 kg)   SpO2 97%   BMI 29.55 kg/m   Wt Readings from Last 3 Encounters:  01/24/20 206 lb 6.4 oz (93.6 kg)  01/12/20 204 lb (92.5 kg)  11/25/18 227 lb (103 kg)    Physical Exam Vitals and nursing note reviewed.  Constitutional:      General: He is awake. He is not in acute distress.    Appearance: He is well-developed, well-groomed and overweight. He is not ill-appearing.  HENT:     Head: Normocephalic and atraumatic.     Right Ear: Hearing normal. No drainage.     Left Ear: Hearing  normal. No drainage.  Eyes:     General: Lids are normal.        Right eye: No discharge.        Left eye: No discharge.     Conjunctiva/sclera: Conjunctivae normal.     Pupils: Pupils are equal, round, and reactive to light.  Neck:     Thyroid: No thyromegaly.     Vascular: No carotid bruit.     Trachea: Trachea normal.  Cardiovascular:     Rate and Rhythm: Normal rate and regular rhythm.     Heart sounds: Normal heart sounds, S1 normal and S2 normal. No murmur. No gallop.   Pulmonary:     Effort: Pulmonary effort is normal. No accessory muscle usage or respiratory distress.     Breath sounds: Normal breath sounds.  Abdominal:     General: Bowel sounds are normal.     Palpations: Abdomen is soft. There is no hepatomegaly or splenomegaly.  Musculoskeletal:        General: Normal range of motion.     Cervical back: Normal range of motion and neck supple.     Right lower leg: No edema.     Left lower leg: No edema.  Lymphadenopathy:     Cervical: No cervical adenopathy.  Skin:    General: Skin is warm and dry.     Capillary Refill: Capillary refill takes less than 2 seconds.  Neurological:     Mental Status: He is alert and oriented to person, place, and time.  Psychiatric:        Attention and Perception: Attention normal.        Mood and Affect: Mood normal.        Speech: Speech normal.        Behavior: Behavior normal. Behavior is cooperative.        Thought Content: Thought content normal.     Results for orders placed or performed in visit on 01/12/20  UA/M w/rflx Culture, Routine   Specimen: Urine   URINE  Result Value Ref Range   Specific Gravity, UA >1.030 (H) 1.005 - 1.030   pH, UA 5.5 5.0 - 7.5   Color, UA Yellow Yellow   Appearance Ur Clear Clear   Leukocytes,UA Negative Negative   Protein,UA Negative Negative/Trace   Glucose, UA Negative Negative   Ketones, UA Trace (A) Negative   RBC, UA Negative Negative   Bilirubin, UA Negative Negative    Urobilinogen, Ur 0.2 0.2 - 1.0 mg/dL   Nitrite, UA Negative Negative      Assessment & Plan:   Problem List Items Addressed This Visit      Cardiovascular and Mediastinum   Hypertension  Chronic, stable BP at goal in office today.  Continue current medication regimen and adjust as needed.  BMP today.   Recommend he focus on DASH diet at home and monitor BP regularly.  Return to office in 6 months for annual physical.      Relevant Orders   Basic metabolic panel   TSH     Respiratory   Sleep apnea    On review of chart has history of OSA, does not wear CPAP (refuses).  Suspect this is underlying reason for some of his fatigue which has been present for > 3 years.  Recommend sleep study and CPAP use, refused.        Other   Depression, recurrent ([place]) - Primary    Chronic, stable.  Denies SI/HI.  Will trial increase Prozac to 40 MG daily, script sent, and recommend he use Melatonin nightly for sleep aide.  Return to office in 6 months for annual physical, sooner if any worsening mood.  He wishes to alert provider if 40 MG is too much, vs having visit, if too much he reports he will return to 30 MG dosing.      Relevant Medications   FLUoxetine (PROZAC) 40 MG capsule   Hypertriglyceridemia    Chronic, ongoing with recent June labs noting ASCVD 12.4%, may benefit from addition of statin, refuses at this time.  Lipid panel today and further discuss, focus heavily on diet changes at home.      Relevant Orders   Lipid Panel w/o Chol/HDL Ratio   Obesity    Recommended eating smaller high protein, low fat meals more frequently and exercising 30 mins a day 5 times a week with a goal of 10-15lb weight loss in the next 3 months. Patient voiced their understanding and motivation to adhere to these recommendations.        Other Visit Diagnoses    Need for Tdap vaccination       Relevant Orders   Tdap vaccine greater than or equal to 7yo IM (Completed)       Follow up  plan: Return in about 6 months (around 07/25/2020) for Annual physical exam.

## 2020-01-24 NOTE — Assessment & Plan Note (Signed)
Chronic, stable.  Denies SI/HI.  Will trial increase Prozac to 40 MG daily, script sent, and recommend he use Melatonin nightly for sleep aide.  Return to office in 6 months for annual physical, sooner if any worsening mood.  He wishes to alert provider if 40 MG is too much, vs having visit, if too much he reports he will return to 30 MG dosing.

## 2020-01-24 NOTE — Assessment & Plan Note (Signed)
Chronic, ongoing with recent June labs noting ASCVD 12.4%, may benefit from addition of statin, refuses at this time.  Lipid panel today and further discuss, focus heavily on diet changes at home.

## 2020-01-24 NOTE — Patient Instructions (Signed)
DASH Eating Plan DASH stands for "Dietary Approaches to Stop Hypertension." The DASH eating plan is a healthy eating plan that has been shown to reduce high blood pressure (hypertension). It may also reduce your risk for type 2 diabetes, heart disease, and stroke. The DASH eating plan may also help with weight loss. What are tips for following this plan?  General guidelines  Avoid eating more than 2,300 mg (milligrams) of salt (sodium) a day. If you have hypertension, you may need to reduce your sodium intake to 1,500 mg a day.  Limit alcohol intake to no more than 1 drink a day for nonpregnant women and 2 drinks a day for men. One drink equals 12 oz of beer, 5 oz of wine, or 1 oz of hard liquor.  Work with your health care provider to maintain a healthy body weight or to lose weight. Ask what an ideal weight is for you.  Get at least 30 minutes of exercise that causes your heart to beat faster (aerobic exercise) most days of the week. Activities may include walking, swimming, or biking.  Work with your health care provider or diet and nutrition specialist (dietitian) to adjust your eating plan to your individual calorie needs. Reading food labels   Check food labels for the amount of sodium per serving. Choose foods with less than 5 percent of the Daily Value of sodium. Generally, foods with less than 300 mg of sodium per serving fit into this eating plan.  To find whole grains, look for the word "whole" as the first word in the ingredient list. Shopping  Buy products labeled as "low-sodium" or "no salt added."  Buy fresh foods. Avoid canned foods and premade or frozen meals. Cooking  Avoid adding salt when cooking. Use salt-free seasonings or herbs instead of table salt or sea salt. Check with your health care provider or pharmacist before using salt substitutes.  Do not fry foods. Cook foods using healthy methods such as baking, boiling, grilling, and broiling instead.  Cook with  heart-healthy oils, such as olive, canola, soybean, or sunflower oil. Meal planning  Eat a balanced diet that includes: ? 5 or more servings of fruits and vegetables each day. At each meal, try to fill half of your plate with fruits and vegetables. ? Up to 6-8 servings of whole grains each day. ? Less than 6 oz of lean meat, poultry, or fish each day. A 3-oz serving of meat is about the same size as a deck of cards. One egg equals 1 oz. ? 2 servings of low-fat dairy each day. ? A serving of nuts, seeds, or beans 5 times each week. ? Heart-healthy fats. Healthy fats called Omega-3 fatty acids are found in foods such as flaxseeds and coldwater fish, like sardines, salmon, and mackerel.  Limit how much you eat of the following: ? Canned or prepackaged foods. ? Food that is high in trans fat, such as fried foods. ? Food that is high in saturated fat, such as fatty meat. ? Sweets, desserts, sugary drinks, and other foods with added sugar. ? Full-fat dairy products.  Do not salt foods before eating.  Try to eat at least 2 vegetarian meals each week.  Eat more home-cooked food and less restaurant, buffet, and fast food.  When eating at a restaurant, ask that your food be prepared with less salt or no salt, if possible. What foods are recommended? The items listed may not be a complete list. Talk with your dietitian about   what dietary choices are best for you. Grains Whole-grain or whole-wheat bread. Whole-grain or whole-wheat pasta. Brown rice. Oatmeal. Quinoa. Bulgur. Whole-grain and low-sodium cereals. Pita bread. Low-fat, low-sodium crackers. Whole-wheat flour tortillas. Vegetables Fresh or frozen vegetables (raw, steamed, roasted, or grilled). Low-sodium or reduced-sodium tomato and vegetable juice. Low-sodium or reduced-sodium tomato sauce and tomato paste. Low-sodium or reduced-sodium canned vegetables. Fruits All fresh, dried, or frozen fruit. Canned fruit in natural juice (without  added sugar). Meat and other protein foods Skinless chicken or turkey. Ground chicken or turkey. Pork with fat trimmed off. Fish and seafood. Egg whites. Dried beans, peas, or lentils. Unsalted nuts, nut butters, and seeds. Unsalted canned beans. Lean cuts of beef with fat trimmed off. Low-sodium, lean deli meat. Dairy Low-fat (1%) or fat-free (skim) milk. Fat-free, low-fat, or reduced-fat cheeses. Nonfat, low-sodium ricotta or cottage cheese. Low-fat or nonfat yogurt. Low-fat, low-sodium cheese. Fats and oils Soft margarine without trans fats. Vegetable oil. Low-fat, reduced-fat, or light mayonnaise and salad dressings (reduced-sodium). Canola, safflower, olive, soybean, and sunflower oils. Avocado. Seasoning and other foods Herbs. Spices. Seasoning mixes without salt. Unsalted popcorn and pretzels. Fat-free sweets. What foods are not recommended? The items listed may not be a complete list. Talk with your dietitian about what dietary choices are best for you. Grains Baked goods made with fat, such as croissants, muffins, or some breads. Dry pasta or rice meal packs. Vegetables Creamed or fried vegetables. Vegetables in a cheese sauce. Regular canned vegetables (not low-sodium or reduced-sodium). Regular canned tomato sauce and paste (not low-sodium or reduced-sodium). Regular tomato and vegetable juice (not low-sodium or reduced-sodium). Pickles. Olives. Fruits Canned fruit in a light or heavy syrup. Fried fruit. Fruit in cream or butter sauce. Meat and other protein foods Fatty cuts of meat. Ribs. Fried meat. Bacon. Sausage. Bologna and other processed lunch meats. Salami. Fatback. Hotdogs. Bratwurst. Salted nuts and seeds. Canned beans with added salt. Canned or smoked fish. Whole eggs or egg yolks. Chicken or turkey with skin. Dairy Whole or 2% milk, cream, and half-and-half. Whole or full-fat cream cheese. Whole-fat or sweetened yogurt. Full-fat cheese. Nondairy creamers. Whipped toppings.  Processed cheese and cheese spreads. Fats and oils Butter. Stick margarine. Lard. Shortening. Ghee. Bacon fat. Tropical oils, such as coconut, palm kernel, or palm oil. Seasoning and other foods Salted popcorn and pretzels. Onion salt, garlic salt, seasoned salt, table salt, and sea salt. Worcestershire sauce. Tartar sauce. Barbecue sauce. Teriyaki sauce. Soy sauce, including reduced-sodium. Steak sauce. Canned and packaged gravies. Fish sauce. Oyster sauce. Cocktail sauce. Horseradish that you find on the shelf. Ketchup. Mustard. Meat flavorings and tenderizers. Bouillon cubes. Hot sauce and Tabasco sauce. Premade or packaged marinades. Premade or packaged taco seasonings. Relishes. Regular salad dressings. Where to find more information:  National Heart, Lung, and Blood Institute: www.nhlbi.nih.gov  American Heart Association: www.heart.org Summary  The DASH eating plan is a healthy eating plan that has been shown to reduce high blood pressure (hypertension). It may also reduce your risk for type 2 diabetes, heart disease, and stroke.  With the DASH eating plan, you should limit salt (sodium) intake to 2,300 mg a day. If you have hypertension, you may need to reduce your sodium intake to 1,500 mg a day.  When on the DASH eating plan, aim to eat more fresh fruits and vegetables, whole grains, lean proteins, low-fat dairy, and heart-healthy fats.  Work with your health care provider or diet and nutrition specialist (dietitian) to adjust your eating plan to your   individual calorie needs. This information is not intended to replace advice given to you by your health care provider. Make sure you discuss any questions you have with your health care provider. Document Revised: 07/17/2017 Document Reviewed: 07/28/2016 Elsevier Patient Education  2020 [employer] Inc.  

## 2020-01-24 NOTE — Assessment & Plan Note (Addendum)
Chronic, stable BP at goal in office today.  Continue current medication regimen and adjust as needed.  BMP today.   Recommend he focus on DASH diet at home and monitor BP regularly.  Return to office in 6 months for annual physical.

## 2020-01-25 LAB — BASIC METABOLIC PANEL
BUN/Creatinine Ratio: 21 (ref 10–24)
BUN: 17 mg/dL (ref 8–27)
CO2: 26 mmol/L (ref 20–29)
Calcium: 9.5 mg/dL (ref 8.6–10.2)
Chloride: 102 mmol/L (ref 96–106)
Creatinine, Ser: 0.82 mg/dL (ref 0.76–1.27)
GFR calc Af Amer: 111 mL/min/{1.73_m2} (ref >59)
GFR calc non Af Amer: 96 mL/min/{1.73_m2} (ref >59)
Glucose: 80 mg/dL (ref 65–99)
Potassium: 4.8 mmol/L (ref 3.5–5.2)
Sodium: 139 mmol/L (ref 134–144)

## 2020-01-25 LAB — LIPID PANEL W/O CHOL/HDL RATIO
Cholesterol, Total: 234 mg/dL — ABNORMAL HIGH (ref 100–199)
HDL: 35 mg/dL — ABNORMAL LOW (ref >39)
LDL Chol Calc (NIH): 168 mg/dL — ABNORMAL HIGH (ref 0–99)
Triglycerides: 167 mg/dL — ABNORMAL HIGH (ref 0–149)
VLDL Cholesterol Cal: 31 mg/dL (ref 5–40)

## 2020-01-25 LAB — TSH: TSH: 2.22 u[IU]/mL (ref 0.450–4.500)

## 2020-01-25 NOTE — Progress Notes (Signed)
Contacted via MyChartGood afternoon Demetri.  Your labs have returned.   - Kidney function and electrolytes are normal. - Thyroid is normal - Cholesterol levels are still elevated, to decrease stroke risk I do recommend starting a statin to help lower these.  For stroke prevention we like to see LDL under 70 and your level is 168.  Side effects with statin can be muscle aches and fatigue, but I find starting out at lowest dose helps decrease these.  Would you like to start a statin or continue to focus on diet?  Let me know.  Have a great day!! Keep being awesome!! Kindest regards, Charitie Hinote

## 2020-01-31 ENCOUNTER — Other Ambulatory Visit: Payer: Self-pay | Admitting: Physician Assistant

## 2020-01-31 DIAGNOSIS — M2392 Unspecified internal derangement of left knee: Secondary | ICD-10-CM

## 2020-02-12 ENCOUNTER — Ambulatory Visit
Admission: RE | Admit: 2020-02-12 | Discharge: 2020-02-12 | Disposition: A | Discharge status: Home / Self Care | Payer: 59 | Source: Ambulatory Visit | Attending: Physician Assistant | Admitting: Physician Assistant

## 2020-02-12 DIAGNOSIS — M2392 Unspecified internal derangement of left knee: Secondary | ICD-10-CM | POA: Diagnosis present

## 2020-04-05 ENCOUNTER — Other Ambulatory Visit: Payer: Self-pay | Admitting: Orthopedic Surgery

## 2020-04-05 DIAGNOSIS — M25511 Pain in right shoulder: Secondary | ICD-10-CM

## 2020-04-11 ENCOUNTER — Other Ambulatory Visit: Payer: Self-pay

## 2020-04-11 ENCOUNTER — Ambulatory Visit
Admission: RE | Admit: 2020-04-11 | Discharge: 2020-04-11 | Disposition: A | Discharge status: Home / Self Care | Payer: 59 | Source: Ambulatory Visit | Attending: Orthopedic Surgery | Admitting: Orthopedic Surgery

## 2020-04-11 DIAGNOSIS — M25511 Pain in right shoulder: Secondary | ICD-10-CM

## 2020-06-27 ENCOUNTER — Other Ambulatory Visit: Payer: Self-pay | Admitting: Orthopedic Surgery

## 2020-07-30 ENCOUNTER — Other Ambulatory Visit
Admission: RE | Admit: 2020-07-30 | Discharge: 2020-07-30 | Disposition: A | Discharge status: Home / Self Care | Payer: 59 | Source: Ambulatory Visit | Attending: Orthopedic Surgery | Admitting: Orthopedic Surgery

## 2020-07-30 ENCOUNTER — Other Ambulatory Visit: Payer: Self-pay

## 2020-07-30 NOTE — Patient Instructions (Signed)
Your procedure is scheduled on: Thursday August 02, 2020. Report to Day Surgery inside [facility] 2nd floor (stop by Registration desk first). To find out your arrival time please call [phone] between 1PM - 3PM on Wednesday August 01, 2020.  Remember: Instructions that are not followed completely may result in serious medical risk,  up to and including death, or upon the discretion of your surgeon and anesthesiologist your  surgery may need to be rescheduled.     _X__ 1. Do not eat food after midnight the night before your procedure.                 No chewing gum or hard candies. You may drink clear liquids up to 2 hours                 before you are scheduled to arrive for your surgery- DO not drink clear                 liquids within 2 hours of the start of your surgery.                 Clear Liquids include:  water, apple juice without pulp, clear Gatorade, G2 or                  Gatorade Zero (avoid Red/Purple/Blue), Black Coffee or Tea (Do not add                 anything to coffee or tea).  __X__2.   Complete the "Ensure Clear Pre-surgery Clear Carbohydrate Drink" provided to you, 2 hours before arrival. **If you are diabetic you will be provided with an alternative drink, Gatorade Zero or G2.  __X__3.  On the morning of surgery brush your teeth with toothpaste and water, you                may rinse your mouth with mouthwash if you wish.  Do not swallow any toothpaste of mouthwash.     _X__ 4.  No Alcohol for 24 hours before or after surgery.   _X__ 5.  Do Not Smoke or use e-cigarettes For 24 Hours Prior to Your Surgery.                 Do not use any chewable tobacco products for at least 6 hours prior to                 Surgery.  _X__  6.  Do not use any recreational drugs (marijuana, cocaine, heroin, ecstasy, MDMA or other)                For at least one week prior to your surgery.  Combination of these drugs with anesthesia                 May have life threatening results.  __X__7.  Notify your doctor if there is any change in your medical condition      (cold, fever, infections).     Do not wear jewelry, make-up, hairpins, clips or nail polish. Do not wear lotions, powders, or perfumes.  Do not shave 48 hours prior to surgery. Men may shave face and neck. Do not bring valuables to the hospital.    [facility] is not responsible for any belongings or valuables.  Contacts, dentures or bridgework may not be worn into surgery. Leave your suitcase in the car. After surgery it may be brought to your room. For  patients admitted to the hospital, discharge time is determined by your treatment team.   Patients discharged the day of surgery will not be allowed to drive home.   Make arrangements for someone to be with you for the first 24 hours of your Same Day Discharge.   __X__ Take these medicines the morning of surgery with A SIP OF WATER:    1. FLUoxetine (PROZAC) 40 MG  2. Cetirizine HCl 10 MG   ____ Fleet Enema (as directed)   __X__ Use CHG Soap (or wipes) as directed  ____ Use Benzoyl Peroxide Gel as instructed  __X__ Use nasal spray on the day of surgery  fluticasone (FLONASE) 50 MCG/ACT nasal spray  ____ Stop metformin 2 days prior to surgery    ____ Take 1/2 of usual insulin dose the night before surgery. No insulin the morning          of surgery.   __X__ Stop aspirin 81 MG as instructed by your provider.   __X__ Stop Anti-inflammatories such as naproxen sodium (ALEVE), Ibuprofen, Advil and or BC powders.    __X__ Stop supplements until after surgery. Omega-3 Fatty Acids (FISH OIL), Glucosamine-Chondroitin-MSM   __X__ Do not start any herbal supplements before your procedure.    If you have any questions regarding your pre-procedure instructions,  Please call Pre-admit Testing at [phone].

## 2020-07-31 ENCOUNTER — Other Ambulatory Visit: Payer: 59

## 2020-07-31 ENCOUNTER — Encounter
Admission: RE | Admit: 2020-07-31 | Discharge: 2020-07-31 | Disposition: A | Discharge status: Home / Self Care | Payer: 59 | Source: Ambulatory Visit | Attending: Orthopedic Surgery | Admitting: Orthopedic Surgery

## 2020-07-31 DIAGNOSIS — Z20822 Contact with and (suspected) exposure to covid-19: Secondary | ICD-10-CM | POA: Diagnosis not present

## 2020-07-31 DIAGNOSIS — I10 Essential (primary) hypertension: Secondary | ICD-10-CM | POA: Insufficient documentation

## 2020-07-31 DIAGNOSIS — Z01818 Encounter for other preprocedural examination: Secondary | ICD-10-CM | POA: Insufficient documentation

## 2020-07-31 LAB — SARS CORONAVIRUS 2 (TAT 6-24 HRS): SARS Coronavirus 2: NEGATIVE

## 2020-07-31 LAB — BASIC METABOLIC PANEL
Anion gap: 8 (ref 5–15)
BUN: 20 mg/dL (ref 8–23)
CO2: 27 mmol/L (ref 22–32)
Calcium: 9.4 mg/dL (ref 8.9–10.3)
Chloride: 105 mmol/L (ref 98–111)
Creatinine, Ser: 0.86 mg/dL (ref 0.61–1.24)
GFR, Estimated: >60 mL/min (ref >60)
Glucose, Bld: 96 mg/dL (ref 70–99)
Potassium: 4.2 mmol/L (ref 3.5–5.1)
Sodium: 140 mmol/L (ref 135–145)

## 2020-07-31 LAB — CBC
HCT: 40.5 % (ref 39.0–52.0)
Hemoglobin: 13.7 g/dL (ref 13.0–17.0)
MCH: 30.6 pg (ref 26.0–34.0)
MCHC: 33.8 g/dL (ref 30.0–36.0)
MCV: 90.6 fL (ref 80.0–100.0)
Platelets: 200 10*3/uL (ref 150–400)
RBC: 4.47 MIL/uL (ref 4.22–5.81)
RDW: 13.1 % (ref 11.5–15.5)
WBC: 6.2 10*3/uL (ref 4.0–10.5)
nRBC: 0 % (ref 0.0–0.2)

## 2020-08-02 ENCOUNTER — Ambulatory Visit
Admission: RE | Admit: 2020-08-02 | Discharge: 2020-08-02 | Disposition: A | Discharge status: Home / Self Care | Payer: 59 | Attending: Orthopedic Surgery | Admitting: Orthopedic Surgery

## 2020-08-02 ENCOUNTER — Encounter: Payer: Self-pay | Admitting: Orthopedic Surgery

## 2020-08-02 ENCOUNTER — Ambulatory Visit: Payer: 59 | Admitting: Certified Registered"

## 2020-08-02 ENCOUNTER — Encounter
Admission: RE | Disposition: A | Discharge status: Home / Self Care | Payer: Self-pay | Source: Home / Self Care | Attending: Orthopedic Surgery

## 2020-08-02 ENCOUNTER — Ambulatory Visit: Payer: 59

## 2020-08-02 ENCOUNTER — Other Ambulatory Visit: Payer: Self-pay

## 2020-08-02 DIAGNOSIS — Z79899 Other long term (current) drug therapy: Secondary | ICD-10-CM | POA: Insufficient documentation

## 2020-08-02 DIAGNOSIS — M19011 Primary osteoarthritis, right shoulder: Secondary | ICD-10-CM | POA: Insufficient documentation

## 2020-08-02 DIAGNOSIS — M25511 Pain in right shoulder: Secondary | ICD-10-CM

## 2020-08-02 DIAGNOSIS — Z7901 Long term (current) use of anticoagulants: Secondary | ICD-10-CM | POA: Diagnosis not present

## 2020-08-02 DIAGNOSIS — Z7982 Long term (current) use of aspirin: Secondary | ICD-10-CM | POA: Insufficient documentation

## 2020-08-02 DIAGNOSIS — M7541 Impingement syndrome of right shoulder: Secondary | ICD-10-CM | POA: Insufficient documentation

## 2020-08-02 DIAGNOSIS — M67813 Other specified disorders of tendon, right shoulder: Secondary | ICD-10-CM | POA: Insufficient documentation

## 2020-08-02 DIAGNOSIS — M75121 Complete rotator cuff tear or rupture of right shoulder, not specified as traumatic: Secondary | ICD-10-CM | POA: Insufficient documentation

## 2020-08-02 SURGERY — SHOULDER ARTHROSCOPY WITH SUBACROMIAL DECOMPRESSION AND DISTAL CLAVICLE EXCISION
Anesthesia: General | Laterality: Right

## 2020-08-02 MED ORDER — ONDANSETRON HCL 4 MG/2ML IJ SOLN
INTRAMUSCULAR | Status: DC | PRN
  Administered 2020-08-02: 4 mg via INTRAVENOUS

## 2020-08-02 MED ORDER — SODIUM CHLORIDE 0.9 % IV SOLN
INTRAVENOUS | Status: DC | PRN
  Administered 2020-08-02: 12:00:00 25 ug/min via INTRAVENOUS

## 2020-08-02 MED ORDER — CHLORHEXIDINE GLUCONATE 0.12 % MT SOLN
OROMUCOSAL | Status: AC
  Filled 2020-08-02: qty 15

## 2020-08-02 MED ORDER — MIDAZOLAM HCL 2 MG/2ML IJ SOLN
INTRAMUSCULAR | Status: AC
  Administered 2020-08-02: 10:00:00 1 mg via INTRAVENOUS
  Filled 2020-08-02: qty 2

## 2020-08-02 MED ORDER — SEVOFLURANE IN SOLN
RESPIRATORY_TRACT | Status: AC
  Filled 2020-08-02: qty 250

## 2020-08-02 MED ORDER — FAMOTIDINE 20 MG PO TABS
ORAL_TABLET | ORAL | Status: AC
  Filled 2020-08-02: qty 1

## 2020-08-02 MED ORDER — ONDANSETRON HCL 4 MG/2ML IJ SOLN: 4.0000 mg | Freq: Once | INTRAMUSCULAR | Status: DC | PRN

## 2020-08-02 MED ORDER — ASPIRIN EC 325 MG PO TBEC: 325.0000 mg | DELAYED_RELEASE_TABLET | Freq: Every day | ORAL | 0 refills | Status: AC

## 2020-08-02 MED ORDER — LACTATED RINGERS IV SOLN: INTRAVENOUS | Status: DC

## 2020-08-02 MED ORDER — LIDOCAINE HCL (PF) 2 % IJ SOLN
INTRAMUSCULAR | Status: AC
  Filled 2020-08-02: qty 5

## 2020-08-02 MED ORDER — FENTANYL CITRATE (PF) 100 MCG/2ML IJ SOLN: 25.0000 ug | INTRAMUSCULAR | Status: DC | PRN

## 2020-08-02 MED ORDER — CEFAZOLIN SODIUM-DEXTROSE 2-4 GM/100ML-% IV SOLN
2.0000 g | INTRAVENOUS | Status: AC
  Administered 2020-08-02: 11:00:00 2 g via INTRAVENOUS

## 2020-08-02 MED ORDER — FAMOTIDINE 20 MG PO TABS
20.0000 mg | ORAL_TABLET | Freq: Once | ORAL | Status: AC
  Administered 2020-08-02: 09:00:00 20 mg via ORAL

## 2020-08-02 MED ORDER — MIDAZOLAM HCL 2 MG/2ML IJ SOLN: 1.0000 mg | Freq: Once | INTRAMUSCULAR | Status: AC

## 2020-08-02 MED ORDER — PROPOFOL 10 MG/ML IV BOLUS
INTRAVENOUS | Status: AC
  Filled 2020-08-02: qty 20

## 2020-08-02 MED ORDER — BUPIVACAINE LIPOSOME 1.3 % IJ SUSP
INTRAMUSCULAR | Status: AC
  Filled 2020-08-02: qty 20

## 2020-08-02 MED ORDER — ROCURONIUM BROMIDE 10 MG/ML (PF) SYRINGE
PREFILLED_SYRINGE | INTRAVENOUS | Status: AC
  Filled 2020-08-02: qty 10

## 2020-08-02 MED ORDER — BUPIVACAINE HCL (PF) 0.5 % IJ SOLN
INTRAMUSCULAR | Status: DC | PRN
  Administered 2020-08-02: 10 mL via PERINEURAL

## 2020-08-02 MED ORDER — LIDOCAINE HCL (PF) 1 % IJ SOLN
INTRAMUSCULAR | Status: AC
  Filled 2020-08-02: qty 5

## 2020-08-02 MED ORDER — FENTANYL CITRATE (PF) 100 MCG/2ML IJ SOLN
50.0000 ug | Freq: Once | INTRAMUSCULAR | Status: AC
Start: 2020-08-02 — End: 2020-08-02

## 2020-08-02 MED ORDER — LIDOCAINE HCL (PF) 1 % IJ SOLN
INTRAMUSCULAR | Status: DC | PRN
  Administered 2020-08-02: 3 mL via SUBCUTANEOUS

## 2020-08-02 MED ORDER — CEFAZOLIN SODIUM-DEXTROSE 2-4 GM/100ML-% IV SOLN
INTRAVENOUS | Status: AC
  Filled 2020-08-02: qty 100

## 2020-08-02 MED ORDER — ONDANSETRON HCL 4 MG/2ML IJ SOLN
INTRAMUSCULAR | Status: AC
  Filled 2020-08-02: qty 2

## 2020-08-02 MED ORDER — OXYCODONE HCL 5 MG PO TABS
5.0000 mg | ORAL_TABLET | ORAL | 0 refills | Status: DC | PRN
Start: 2020-08-02 — End: 2021-06-20

## 2020-08-02 MED ORDER — ONDANSETRON 4 MG PO TBDP: 4.0000 mg | ORAL_TABLET | Freq: Three times a day (TID) | ORAL | 0 refills | Status: DC | PRN

## 2020-08-02 MED ORDER — PROPOFOL 10 MG/ML IV BOLUS
INTRAVENOUS | Status: DC | PRN
  Administered 2020-08-02: 150 mg via INTRAVENOUS

## 2020-08-02 MED ORDER — DEXAMETHASONE SODIUM PHOSPHATE 10 MG/ML IJ SOLN
INTRAMUSCULAR | Status: AC
  Filled 2020-08-02: qty 1

## 2020-08-02 MED ORDER — PHENYLEPHRINE HCL (PRESSORS) 10 MG/ML IV SOLN
INTRAVENOUS | Status: AC
  Filled 2020-08-02: qty 1

## 2020-08-02 MED ORDER — BUPIVACAINE HCL (PF) 0.5 % IJ SOLN
INTRAMUSCULAR | Status: AC
  Filled 2020-08-02: qty 10

## 2020-08-02 MED ORDER — ROCURONIUM BROMIDE 100 MG/10ML IV SOLN
INTRAVENOUS | Status: DC | PRN
  Administered 2020-08-02: 100 mg via INTRAVENOUS

## 2020-08-02 MED ORDER — FENTANYL CITRATE (PF) 250 MCG/5ML IJ SOLN
INTRAMUSCULAR | Status: AC
  Filled 2020-08-02: qty 5

## 2020-08-02 MED ORDER — LIDOCAINE HCL (CARDIAC) PF 100 MG/5ML IV SOSY
PREFILLED_SYRINGE | INTRAVENOUS | Status: DC | PRN
  Administered 2020-08-02: 50 mg via INTRAVENOUS

## 2020-08-02 MED ORDER — BUPIVACAINE LIPOSOME 1.3 % IJ SUSP
INTRAMUSCULAR | Status: DC | PRN
  Administered 2020-08-02: 20 mL via PERINEURAL

## 2020-08-02 MED ORDER — FENTANYL CITRATE (PF) 100 MCG/2ML IJ SOLN
INTRAMUSCULAR | Status: DC | PRN
  Administered 2020-08-02: 100 ug via INTRAVENOUS
  Administered 2020-08-02: 50 ug via INTRAVENOUS
  Administered 2020-08-02: 100 ug via INTRAVENOUS

## 2020-08-02 MED ORDER — SUGAMMADEX SODIUM 200 MG/2ML IV SOLN
INTRAVENOUS | Status: DC | PRN
  Administered 2020-08-02: 200 mg via INTRAVENOUS

## 2020-08-02 MED ORDER — EPHEDRINE SULFATE 50 MG/ML IJ SOLN
INTRAMUSCULAR | Status: DC | PRN
  Administered 2020-08-02: 20 mg via INTRAVENOUS
  Administered 2020-08-02: 10 mg via INTRAVENOUS

## 2020-08-02 MED ORDER — ORAL CARE MOUTH RINSE: 15.0000 mL | Freq: Once | OROMUCOSAL | Status: AC

## 2020-08-02 MED ORDER — LACTATED RINGERS IV SOLN
INTRAVENOUS | Status: DC | PRN
  Administered 2020-08-02: 12:00:00 12000 mL

## 2020-08-02 MED ORDER — DEXAMETHASONE SODIUM PHOSPHATE 10 MG/ML IJ SOLN
INTRAMUSCULAR | Status: DC | PRN
  Administered 2020-08-02: 10 mg via INTRAVENOUS

## 2020-08-02 MED ORDER — ACETAMINOPHEN 500 MG PO TABS: 1000.0000 mg | ORAL_TABLET | Freq: Three times a day (TID) | ORAL | 2 refills | Status: AC

## 2020-08-02 MED ORDER — CHLORHEXIDINE GLUCONATE 0.12 % MT SOLN
15.0000 mL | Freq: Once | OROMUCOSAL | Status: AC
  Administered 2020-08-02: 09:00:00 15 mL via OROMUCOSAL

## 2020-08-02 MED ORDER — FENTANYL CITRATE (PF) 100 MCG/2ML IJ SOLN
INTRAMUSCULAR | Status: AC
  Administered 2020-08-02: 10:00:00 50 ug via INTRAVENOUS
  Filled 2020-08-02: qty 2

## 2020-08-02 SURGICAL SUPPLY — 80 items
ADAPTER IRRIG TUBE 2 SPIKE SOL (ADAPTER) ×6 IMPLANT
ADPR TBG 2 SPK PMP STRL ASCP (ADAPTER) ×2
APL PRP STRL LF DISP 70% ISPRP (MISCELLANEOUS) ×1
BRUSH SCRUB EZ  4% CHG (MISCELLANEOUS) ×3
BRUSH SCRUB EZ 4% CHG (MISCELLANEOUS) ×1 IMPLANT
BUR RADIUS 4.0X18.5 (BURR) ×3 IMPLANT
BUR RADIUS 5.5 (BURR) ×3 IMPLANT
CANNULA 5.75X7CM (CANNULA) ×2
CANNULA PART THRD DISP 5.75X7 (CANNULA) ×4 IMPLANT
CANNULA PARTIAL THREAD 2X7 (CANNULA) ×3 IMPLANT
CANNULA TWIST IN 8.25X9CM (CANNULA) IMPLANT
CHLORAPREP W/TINT 26 (MISCELLANEOUS) ×3 IMPLANT
COOLER POLAR GLACIER W/PUMP (MISCELLANEOUS) ×3 IMPLANT
COVER WAND RF STERILE (DRAPES) ×3 IMPLANT
DEVICE SUCT BLK HOLE OR FLOOR (MISCELLANEOUS) ×6 IMPLANT
DRAPE 3/4 80X56 (DRAPES) ×3 IMPLANT
DRAPE IMP U-DRAPE 54X76 (DRAPES) ×6 IMPLANT
DRAPE INCISE IOBAN 66X45 STRL (DRAPES) ×3 IMPLANT
DRAPE SPLIT 6X30 W/TAPE (DRAPES) ×6 IMPLANT
DRAPE STERI 35X30 U-POUCH (DRAPES) ×3 IMPLANT
DRAPE U-SHAPE 47X51 STRL (DRAPES) ×6 IMPLANT
DRSG TEGADERM 4X4.75 (GAUZE/BANDAGES/DRESSINGS) ×6 IMPLANT
ELECT REM PT RETURN 9FT ADLT (ELECTROSURGICAL) ×3
ELECTRODE REM PT RTRN 9FT ADLT (ELECTROSURGICAL) IMPLANT
GAUZE SPONGE 4X4 12PLY STRL (GAUZE/BANDAGES/DRESSINGS) ×3 IMPLANT
GAUZE XEROFORM 1X8 LF (GAUZE/BANDAGES/DRESSINGS) ×3 IMPLANT
GLOVE BIO SURGEON STRL SZ7.5 (GLOVE) ×3 IMPLANT
GLOVE BIOGEL PI IND STRL 8 (GLOVE) ×2 IMPLANT
GLOVE BIOGEL PI INDICATOR 8 (GLOVE) ×4
GLOVE SURG ORTHO 8.0 STRL STRW (GLOVE) ×3 IMPLANT
GLOVE SURG SYN 7.5  E (GLOVE) ×3
GLOVE SURG SYN 7.5 E (GLOVE) ×1 IMPLANT
GLOVE SURG SYN 7.5 PF PI (GLOVE) ×1 IMPLANT
GOWN STRL REUS W/ TWL LRG LVL3 (GOWN DISPOSABLE) ×2 IMPLANT
GOWN STRL REUS W/TWL LRG LVL3 (GOWN DISPOSABLE) ×6
GOWN STRL REUS W/TWL LRG LVL4 (GOWN DISPOSABLE) ×3 IMPLANT
IMP SYSTEM BRIDGE 4.75X19.1 (Anchor) ×3 IMPLANT
IMPL SYSTEM BRIDGE 4.75X19.1 (Anchor) IMPLANT
IV LACTATED RINGER IRRG 3000ML (IV SOLUTION) ×12
IV LR IRRIG 3000ML ARTHROMATIC (IV SOLUTION) ×4 IMPLANT
KIT STABILIZATION SHOULDER (MISCELLANEOUS) ×3 IMPLANT
KIT SUTURETAK 3.0 INSERT PERC (KITS) ×3 IMPLANT
KIT TURNOVER KIT A (KITS) ×3 IMPLANT
MANIFOLD NEPTUNE II (INSTRUMENTS) ×6 IMPLANT
MASK FACE SPIDER DISP (MASK) ×3 IMPLANT
MAT ABSORB  FLUID 56X50 GRAY (MISCELLANEOUS) ×6
MAT ABSORB FLUID 56X50 GRAY (MISCELLANEOUS) ×2 IMPLANT
NDL SAFETY ECLIPSE 18X1.5 (NEEDLE) ×1 IMPLANT
NDL SCORPION MULTI FIRE (NEEDLE) IMPLANT
NEEDLE HYPO 18GX1.5 SHARP (NEEDLE) ×3
NEEDLE HYPO 22GX1.5 SAFETY (NEEDLE) ×3 IMPLANT
NEEDLE SCORPION MULTI FIRE (NEEDLE) ×3 IMPLANT
NS IRRIG 500ML POUR BTL (IV SOLUTION) ×3 IMPLANT
PACK ARTHROSCOPY SHOULDER (MISCELLANEOUS) ×3 IMPLANT
PAD ABD DERMACEA PRESS 5X9 (GAUZE/BANDAGES/DRESSINGS) ×3 IMPLANT
PAD ARMBOARD 7.5X6 YLW CONV (MISCELLANEOUS) ×3 IMPLANT
PAD WRAPON POLAR SHDR XLG (MISCELLANEOUS) ×1 IMPLANT
PENCIL SMOKE ULTRAEVAC 22 CON (MISCELLANEOUS) ×3 IMPLANT
SET TUBE SUCT SHAVER OUTFL 24K (TUBING) ×3 IMPLANT
SET TUBE TIP INTRA-ARTICULAR (MISCELLANEOUS) ×3 IMPLANT
SLING ULTRA II M (MISCELLANEOUS) ×3 IMPLANT
STRAP SAFETY 5IN WIDE (MISCELLANEOUS) ×3 IMPLANT
SUT ETHILON 3-0 FS-10 30 BLK (SUTURE) ×3
SUT LASSO 90 DEG SD STR (SUTURE) ×3 IMPLANT
SUT MNCRL 4-0 (SUTURE) ×3
SUT MNCRL 4-0 27XMFL (SUTURE) ×1
SUT PDS AB 0 CT1 27 (SUTURE) ×3 IMPLANT
SUT VIC AB 0 CT1 36 (SUTURE) ×3 IMPLANT
SUT VIC AB 2-0 CT2 27 (SUTURE) ×3 IMPLANT
SUTURE EHLN 3-0 FS-10 30 BLK (SUTURE) ×1 IMPLANT
SUTURE MNCRL 4-0 27XMF (SUTURE) ×1 IMPLANT
SYR 10ML LL (SYRINGE) ×3 IMPLANT
SYSTEM FBRTK BICEPS 1.9 DRILL (Anchor) ×2 IMPLANT
TAPE CLOTH 3X10 WHT NS LF (GAUZE/BANDAGES/DRESSINGS) ×3 IMPLANT
TUBING ARTHRO INFLOW-ONLY STRL (TUBING) ×3 IMPLANT
TUBING CONNECTING 10 (TUBING) ×2 IMPLANT
TUBING CONNECTING 10' (TUBING) ×1
WAND WEREWOLF FLOW 90D (MISCELLANEOUS) IMPLANT
WRAP SHOULDER HOT/COLD PACK (SOFTGOODS) ×3 IMPLANT
WRAPON POLAR PAD SHDR XLG (MISCELLANEOUS) ×3

## 2020-08-02 NOTE — H&P (Signed)
Paper H&P to be scanned into permanent record. H&P reviewed. No significant changes noted.  

## 2020-08-02 NOTE — Op Note (Signed)
SURGERY DATE: 08/02/2020    PRE-OP DIAGNOSIS:  1. Right subacromial impingement 2. Right biceps tendinopathy 3. Right rotator cuff tear 4. Right acromioclavicular joint osteoarthritis  POST-OP DIAGNOSIS: 1. Right subacromial impingement 2. Right biceps tendinopathy 3. Right rotator cuff tear 4. Right acromioclavicular joint osteoarthritis  PROCEDURES:  1. Right mini-open rotator cuff repair 2. Right open biceps tenodesis 3. Right arthroscopic distal clavicle excision 4. Right arthroscopic extensive debridement of shoulder (glenohumeral and subacromial spaces) 5. Right arthroscopic subacromial decompression  SURGEON: Cato Mulligan, MD  ASSISTANT: Anitra Lauth, PA   ANESTHESIA: Gen with Exparil interscalene block   ESTIMATED BLOOD LOSS: 25cc   DRAINS:  none   TOTAL IV FLUIDS: per anesthesia      SPECIMENS: none   IMPLANTS:  - Arthrex 4.27m SwiveLock x 4 (SpeedBridge Kit) - Arthrex 1.832mKnotless FiberTak   OPERATIVE FINDINGS:  Examination under anesthesia: A careful examination under anesthesia was performed.  Passive range of motion was: FF: 150; ER at side: 45; ER in abduction: 90; IR in abduction: 45.  Anterior load shift: NT.  Posterior load shift: NT.  Sulcus in neutral: NT.  Sulcus in ER: NT.     Intra-operative findings: A thorough arthroscopic examination of the shoulder was performed.  The findings are: 1. Biceps tendon:  Biceps tendinopathy with proximal erythema 2. Superior labrum: injected with surrounding synovitis 3. Posterior labrum and capsule: normal 4. Inferior capsule and inferior recess: normal 5. Glenoid cartilage surface: Normal 6. Supraspinatus attachment: full-thickness tear 7. Posterior rotator cuff attachment: normal 8. Humeral head articular cartilage: normal 9. Rotator interval: significant synovitis 10: Subscapularis tendon:  Normal 11. Anterior labrum: degenerative 12. IGHL: normal   OPERATIVE REPORT:    Indications for  procedure: ClXIONG HAIDARs a [age].o. male with shoulder pain that began many years ago but significantly worsened over the past 10 months.  He has failed over 6 months of non-operative management including activity modification, physical therapy, medical management and corticosteroid injection without adequate relief of symptoms. Clinical exam and MRI were suggestive of rotator cuff tear,  biceps tendinopathy, subacromial impingement, and acromioclavicular joint arthritis. After discussion of risks, benefits, and alternatives to surgery, the patient elected to proceed.    Procedure in detail:   I identified Husein E Merida in the pre-operative holding area.  I marked the operative shoulder with my initials. I reviewed the risks and benefits of the proposed surgical intervention, and the patient (and/or patient's guardian) wished to proceed.  Anesthesia was then performed with an Exparil interscalene block.  The patient was transferred to the operative suite and placed in the beach chair position.     SCDs were placed on the lower extremities. Appropriate IV antibiotics were administered prior to incision. The operative upper extremity was then prepped and draped in standard fashion. A time out was performed confirming the correct extremity, correct patient, and correct procedure.    I then created a standard posterior portal with an 11 blade. The glenohumeral joint was easily entered with a blunt trochar and the arthroscope introduced. The findings of diagnostic arthroscopy are described above. I debrided synovitic tissue about the rotator interval and anterior and superior labrum. I then coagulated the inflamed synovium to obtain hemostasis and reduce the risk of post-operative swelling using an Arthrocare radiofrequency device.  Next, I used arthroscopic scissors to cut the proximal biceps tendon at its insertion on the superior labrum.  A shaver was used to debride the stump down.  Next, the  arthroscope was then introduced into the subacromial space. A direct lateral portal was created with an 11-blade after spinal needle localization. An extensive subacromial bursectomy was performed using a combination of the shaver and Arthrocare wand. The entire acromial undersurface was exposed and the CA ligament was subperiosteally elevated to expose the anterior acromial hook. A 5.59m barrel burr was used to create a flat anterior and lateral aspect of the acromion, converting it from a Type 2 to a Type 1 acromion. Care was made to keep the deltoid fascia intact.   I then turned my attention to the arthroscopic distal clavicle excision. I identified the acromioclavicular joint. Surrounding bursal tissue was debrided and the edges of the joint were identified. I used the 5.529mbarrel burr to remove the distal clavicle parallel to the edge of the acromion. I was able to fit two widths of the burr into the space between the distal clavicle and acromion, signifying that I had removed ~1155mf distal clavicle.  The supraspinatus was visualized from the lateral portal and a full-thickness tear was noted.  Hemostasis was achieved with an Arthrocare wand. Fluid was evacuated from the shoulder.    A longitudinal incision from the anterolateral acromion ~6cm in length was made overlying the raphe between the anterior and middle heads of the deltoid. The raphe was identified and it was incised. The subacromial space was identified. Any remaining bursa was excised. The rotator cuff tear was identified. It was a  U-shaped tear.  We then turned our attention to the biceps tenodesis. The arm was externally rotated.  The bicipital groove was identified.  A 15 blade was used to make a cut overlying the biceps tendon, and the tendon was removed using a right angle clamp.  The base of the bicipital groove was identified and cleared of soft tissue.  A FiberTak anchor was placed in the bicipital groove.  The biceps tendon was  held at the appropriate amount of tension.  One set of sutures was passed through the biceps anchor with one limb passed in a simple fashion and the second limb passed in a simple plus locking stitch pattern.  The other set of sutures was placed through each other during suture passage and cannot be undone so the set of sutures was removed.  Using the remaining sutures, the biceps tendon was shuttled down to the bone.  The sutures were tied and cut.  This allowed for excellent fixation of the biceps. The diseased portion of the proximal biceps was then excised.   The rotator cuff footprint was cleared of soft tissue.    A burr was used to allow for bleeding bed over the greater tuberosity to allow for improved healing =. Two Arthrex speed bridge medial row 4.75 mm anchors were placed at the anterior and posterior aspects of the tear just lateral to the articular margin.  The combined strand from each anchor was passed through the supraspinatus.  The free FiberWire sutures from the medial row were also passed through the rotator cuff. Next, Two SwiveLock anchors were placed for the lateral row anchors with one limb of each of the four medial row sutures passed through each anchor.    This allowed for excellent reapproximation and compression of the rotator cuff over its footprint. The construct was stable with external and internal rotation.          The wound was thoroughly irrigated.  The deltoid split was closed with 0 Vicryl.  The  subdermal layer was closed with 2-0 Vicryl.  The skin was closed with 4-0 Monocryl in a running subcuticular fashion and then with Dermabond. The portals were closed with 3-0 Nylon. Xeroform was applied to the incisions. A sterile dressing was applied, followed by a Polar Care sleeve and a SlingShot shoulder immobilizer/sling. The patient was awakened from anesthesia without difficulty and was transferred to the PACU in stable condition.    Of note, assistance from a PA was  essential to performing the surgery.  PA was present for the entire surgery.  PA assisted with patient positioning, retraction, instrumentation, and wound closure. The surgery would have been more difficult and had longer operative time without PA assistance.     COMPLICATIONS: none   DISPOSITION: plan for discharge home after recovery in PACU  POSTOPERATIVE PLAN: Remain in sling (except hygiene and elbow/wrist/hand RoM exercises as instructed by PT) x 6 weeks and NWB for this time. PT to begin 3-4 days after surgery. Rotator cuff repair and biceps tenodesis rehab protocol. ASA 310m daily x 2 weeks for DVT ppx.

## 2020-08-02 NOTE — Anesthesia Procedure Notes (Signed)
Anesthesia Regional Block: Interscalene brachial plexus block   Pre-Anesthetic Checklist: ,, timeout performed, Correct Patient, Correct Site, Correct Laterality, Correct Procedure, Correct Position, site marked, Risks and benefits discussed,  Surgical consent,  Pre-op evaluation,  At surgeon's request and post-op pain management  Laterality: Right  Prep: chloraprep       Needles:  Injection technique: Single-shot  Needle Type: Echogenic Stimulator Needle     Needle Length: 10cm  Needle Gauge: 20     Additional Needles:   Procedures:, nerve stimulator,,, ultrasound used (permanent image in chart),,,,  Narrative:  Start time: 08/02/2020 10:14 AM End time: 08/02/2020 10:20 AM Injection made incrementally with aspirations every 5 mL.  Performed by: Personally   Additional Notes: Functioning IV was confirmed and monitors were applied.  . Sterile prep and drape,hand hygiene and sterile gloves were used.  Negative aspiration and negative test dose prior to incremental administration of local anesthetic. The patient tolerated the procedure well.

## 2020-08-02 NOTE — Transfer of Care (Signed)
Immediate Anesthesia Transfer of Care Note  Patient: SYAIRE SABER  Procedure(s) Performed: Right shoulder arthroscopic vs mini-open rotator cuff repair, biceps tenodesis, distal clavicle excision, subacromial decompression - Dedra Skeens to Assist (Right )  Patient Location: PACU  Anesthesia Type:GA combined with regional for post-op pain  Level of Consciousness: sedated  Airway & Oxygen Therapy: Patient Spontanous Breathing and Patient connected to face mask oxygen  Post-op Assessment: Report given to RN and Post -op Vital signs reviewed and stable  Post vital signs: Reviewed and stable  Last Vitals:  Vitals Value Taken Time  BP 149/82 08/02/20 1341  Temp 35.9 C 08/02/20 1341  Pulse 69 08/02/20 1346  Resp 11 08/02/20 1346  SpO2 97 % 08/02/20 1346  Vitals shown include unvalidated device data.  Last Pain:  Vitals:   08/02/20 0920  TempSrc: Temporal  PainSc: 2          Complications: No complications documented.

## 2020-08-02 NOTE — Anesthesia Procedure Notes (Signed)
Procedure Name: Intubation Performed by: Gaynelle Cage, CRNA Pre-anesthesia Checklist: Patient identified, Emergency Drugs available, Suction available and Patient being monitored Patient Re-evaluated:Patient Re-evaluated prior to induction Oxygen Delivery Method: Circle system utilized Preoxygenation: Pre-oxygenation with 100% oxygen Induction Type: IV induction Ventilation: Mask ventilation without difficulty and Oral airway inserted - appropriate to patient size Laryngoscope Size: Mac and 3 Grade View: Grade I Tube type: Oral Tube size: 7.5 mm Number of attempts: 1 Airway Equipment and Method: Oral airway Placement Confirmation: ETT inserted through vocal cords under direct vision,  positive ETCO2 and breath sounds checked- equal and bilateral Secured at: 22 cm Tube secured with: Tape Dental Injury: Teeth and Oropharynx as per pre-operative assessment

## 2020-08-02 NOTE — Anesthesia Preprocedure Evaluation (Signed)
Anesthesia Evaluation  Patient identified by MRN, date of birth, ID band Patient awake    Reviewed: Allergy & Precautions, H&P , NPO status , Patient's Chart, lab work & pertinent test results, reviewed documented beta blocker date and time   Airway Mallampati: II  TM Distance: >3 FB Neck ROM: full    Dental  (+) Teeth Intact   Pulmonary sleep apnea ,    Pulmonary exam normal        Cardiovascular Exercise Tolerance: Good hypertension, On Medications negative cardio ROS Normal cardiovascular exam Rhythm:regular Rate:Normal     Neuro/Psych Depression negative neurological ROS  negative psych ROS   GI/Hepatic negative GI ROS, Neg liver ROS,   Endo/Other  negative endocrine ROS  Renal/GU negative Renal ROS  negative genitourinary   Musculoskeletal   Abdominal   Peds  Hematology negative hematology ROS (+)   Anesthesia Other Findings Past Medical History: No date: Allergy No date: Depression No date: Hyperlipidemia No date: Hypertension No date: Osteoarthritis No date: Sleep apnea Past Surgical History: No date: disk repair No date: FOOT SURGERY; Right No date: TONSILLECTOMY   Reproductive/Obstetrics negative OB ROS                             Anesthesia Physical Anesthesia Plan  ASA: II  Anesthesia Plan: General ETT   Post-op Pain Management:    Induction:   PONV Risk Score and Plan:   Airway Management Planned:   Additional Equipment:   Intra-op Plan:   Post-operative Plan:   Informed Consent: I have reviewed the patients History and Physical, chart, labs and discussed the procedure including the risks, benefits and alternatives for the proposed anesthesia with the patient or authorized representative who has indicated his/her understanding and acceptance.     Dental Advisory Given  Plan Discussed with: CRNA  Anesthesia Plan Comments:         Anesthesia  Quick Evaluation

## 2020-08-02 NOTE — Discharge Instructions (Addendum)
Post-Op Instructions - Rotator Cuff Repair ° °1. Bracing: You will wear a shoulder immobilizer or sling for 6 weeks.  ° °2. Driving: No driving for 3 weeks post-op. When driving, do not wear the immobilizer. Ideally, we recommend no driving for 6 weeks while sling is in place as one arm will be immobilized.  ° °3. Activity: No active lifting for 2 months. Wrist, hand, and elbow motion only. Avoid lifting the upper arm away from the body except for hygiene. You are permitted to bend and straighten the elbow passively only (no active elbow motion). You may use your hand and wrist for typing, writing, and managing utensils (cutting food). Do not lift more than a coffee cup for 8 weeks.  When sleeping or resting, inclined positions (recliner chair or wedge pillow) and a pillow under the forearm for support may provide better comfort for up to 4 weeks.  Avoid long distance travel for 4 weeks. ° °Return to normal activities after rotator cuff repair repair normally takes 6 months on average. If rehab goes very well, may be able to do most activities at 4 months, except overhead or contact sports. ° °4. Physical Therapy: Begins 3-4 days after surgery, and proceed 1 time per week for the first 6 weeks, then 1-2 times per week from weeks 6-20 post-op. ° °5. Medications:  °- You will be provided a prescription for narcotic pain medicine. After surgery, take 1-2 narcotic tablets every 4 hours if needed for severe pain.  °- A prescription for anti-nausea medication will be provided in case the narcotic medicine causes nausea - take 1 tablet every 6 hours only if nauseated.   °- Take tylenol 1000 mg (2 Extra Strength tablets or 3 regular strength) every 8 hours for pain.  May decrease or stop tylenol 5 days after surgery if you are having minimal pain. °- Take ASA 325mg/day x 2 weeks to help prevent DVTs/PEs (blood clots).  °- DO NOT take ANY nonsteroidal anti-inflammatory pain medications (Advil, Motrin, Ibuprofen, Aleve,  Naproxen, or Naprosyn). These medicines can inhibit healing of your shoulder repair.  ° ° °If you are taking prescription medication for anxiety, depression, insomnia, muscle spasm, chronic pain, or for attention deficit disorder, you are advised that you are at a higher risk of adverse effects with use of narcotics post-op, including narcotic addiction/dependence, depressed breathing, death. °If you use non-prescribed substances: alcohol, marijuana, cocaine, heroin, methamphetamines, etc., you are at a higher risk of adverse effects with use of narcotics post-op, including narcotic addiction/dependence, depressed breathing, death. °You are advised that taking > 50 morphine milligram equivalents (MME) of narcotic pain medication per day results in twice the risk of overdose or death. For your prescription provided: oxycodone 5 mg - taking more than 6 tablets per day would result in > 50 morphine milligram equivalents (MME) of narcotic pain medication. °Be advised that we will prescribe narcotics short-term, for acute post-operative pain only - 3 weeks for major operations such as shoulder repair/reconstruction surgeries.  ° ° ° °6. Post-Op Appointment: ° °Your first post-op appointment will be 10-14 days post-op. ° °7. Work or School: For most, but not all procedures, we advise staying out of work or school for at least 1 to 2 weeks in order to recover from the stress of surgery and to allow time for healing.  ° °If you need a work or school note this can be provided.  ° °8. Smoking: If you are a smoker, you need to refrain from   smoking in the postoperative period. The nicotine in cigarettes will inhibit healing of your shoulder repair and decrease the chance of successful repair. Similarly, nicotine containing products (gum, patches) should be avoided.  ° °Post-operative Brace: °Apply and remove the brace you received as you were instructed to at the time of fitting and as described in detail as the brace’s  instructions for use indicate.  Wear the brace for the period of time prescribed by your physician.  The brace can be cleaned with soap and water and allowed to air dry only.  Should the brace result in increased pain, decreased feeling (numbness/tingling), increased swelling or an overall worsening of your medical condition, please contact your doctor immediately.  If an emergency situation occurs as a result of wearing the brace after normal business hours, please dial 911 and seek immediate medical attention.  Let your doctor know if you have any further questions about the brace issued to you. °Refer to the shoulder sling instructions for use if you have any questions regarding the correct fit of your shoulder sling.  °[facility] Customer Care for Troubleshooting: [phone] ° °Video that illustrates how to properly use a shoulder sling: °"Instructions for Proper Use of an Orthopaedic Sling" °https://www.youtube.com/watch?v=AHZpn_Xo45w ° °AMBULATORY SURGERY  °DISCHARGE INSTRUCTIONS ° ° °1) The drugs that you were given will stay in your system until tomorrow so for the next 24 hours you should not: ° °A) Drive an automobile °B) Make any legal decisions °C) Drink any alcoholic beverage ° ° °2) You may resume regular meals tomorrow.  Today it is better to start with liquids and gradually work up to solid foods. ° °You may eat anything you prefer, but it is better to start with liquids, then soup and crackers, and gradually work up to solid foods. ° ° °3) Please notify your doctor immediately if you have any unusual bleeding, trouble breathing, redness and pain at the surgery site, drainage, fever, or pain not relieved by medication. ° ° ° °4) Additional Instructions: ° ° ° ° °Please contact your physician with any problems or Same Day Surgery at [phone], Monday through Friday 6 am to 4 pm, or [facility] number at [phone].  °   ° °                                                                                                                       Interscalene Nerve Block with Exparel ° °1.  For your surgery you have received an Interscalene Nerve Block with Exparel. °2. Nerve Blocks affect many types of nerves, including nerves that control movement, pain and normal sensation.  You may experience feelings such as numbness, tingling, heaviness, weakness or the inability to move your arm or the feeling or sensation that your arm has "fallen asleep". °3. A nerve block with Exparel can last up to 5 days.  Usually the weakness wears off first.  The tingling and heaviness usually wear off next.  Finally you   may start to notice pain.  Keep in mind that this may occur in any order.  Once a nerve block starts to wear off it is usually completely gone within 60 minutes. °4. ISNB may cause mild shortness of breath, a hoarse voice, blurry vision, unequal pupils, or drooping of the face on the same side as the nerve block.  These symptoms will usually resolve with the numbness.  Very rarely the procedure itself can cause mild seizures. °5. If needed, your surgeon will give you a prescription for pain medication.  It will take about 60 minutes for the oral pain medication to become fully effective.  So, it is recommended that you start taking this medication before the nerve block first begins to wear off, or when you first begin to feel discomfort. °6. Take your pain medication only as prescribed.  Pain medication can cause sedation and decrease your breathing if you take more than you need for the level of pain that you have. °7. Nausea is a common side effect of many pain medications.  You may want to eat something before taking your pain medicine to prevent nausea. °8. After an Interscalene nerve block, you cannot feel pain, pressure or extremes in temperature in the effected arm.  Because your arm is numb it is at an increased risk for injury.  To decrease the possibility of injury, please practice the  following: ° °a. While you are awake change the position of your arm frequently to prevent too much pressure on any one area for prolonged periods of time. °b.  If you have a cast or tight dressing, check the color or your fingers every couple of hours.  Call your surgeon with the appearance of any discoloration (white or blue). °c. If you are given a sling to wear before you go home, please wear it  at all times until the block has completely worn off.  Do not get up at night without your sling. °d. Please contact [facility] or your surgeon if you do not begin to regain sensation after 7 days from the surgery.  Anesthesia may be contacted by calling the Same Day Surgery Department, Mon. through Fri., 6 am to 4 pm at [phone].   °e. If you experience any other problems or concerns, please contact your surgeon's office. °If you experience severe or prolonged shortness of breath go to the nearest emergency department. ° ° ° °

## 2020-08-03 NOTE — OR Nursing (Signed)
Polar care is not functioning.  Will send his brother to pick up another one on Monday at 3:00PM and return the non-functioning one.

## 2020-08-13 NOTE — Anesthesia Postprocedure Evaluation (Signed)
Anesthesia Post Note  Patient: TAELYN NEMES  Procedure(s) Performed: Right shoulder arthroscopic vs mini-open rotator cuff repair, biceps tenodesis, distal clavicle excision, subacromial decompression - Dedra Skeens to Assist (Right )  Patient location during evaluation: PACU Anesthesia Type: General Level of consciousness: awake and alert Pain management: pain level controlled Vital Signs Assessment: post-procedure vital signs reviewed and stable Respiratory status: spontaneous breathing, nonlabored ventilation, respiratory function stable and patient connected to nasal cannula oxygen Cardiovascular status: blood pressure returned to baseline and stable Postop Assessment: no apparent nausea or vomiting Anesthetic complications: no   No complications documented.   Last Vitals:  Vitals:   08/02/20 1415 08/02/20 1458  BP: (!) 152/85 (!) 154/97  Pulse: 81 78  Resp:  14  Temp: 36.4 C (!) 36.2 C  SpO2: 98% 99%    Last Pain:  Vitals:   08/03/20 0849  TempSrc:   PainSc: 2                  Yevette Edwards

## 2020-08-14 ENCOUNTER — Encounter: Payer: 59 | Admitting: Nurse Practitioner

## 2020-09-12 ENCOUNTER — Other Ambulatory Visit: Payer: Self-pay

## 2020-09-12 ENCOUNTER — Ambulatory Visit (INDEPENDENT_AMBULATORY_CARE_PROVIDER_SITE_OTHER): Payer: 59 | Admitting: Nurse Practitioner

## 2020-09-12 ENCOUNTER — Encounter: Payer: Self-pay | Admitting: Nurse Practitioner

## 2020-09-12 DIAGNOSIS — I10 Essential (primary) hypertension: Secondary | ICD-10-CM | POA: Diagnosis not present

## 2020-09-12 DIAGNOSIS — F339 Major depressive disorder, recurrent, unspecified: Secondary | ICD-10-CM | POA: Diagnosis not present

## 2020-09-12 DIAGNOSIS — E782 Mixed hyperlipidemia: Secondary | ICD-10-CM | POA: Diagnosis not present

## 2020-09-12 MED ORDER — CYCLOBENZAPRINE HCL 10 MG PO TABS: 10.0000 mg | ORAL_TABLET | Freq: Three times a day (TID) | ORAL | 1 refills | Status: DC | PRN

## 2020-09-12 MED ORDER — BENAZEPRIL HCL 20 MG PO TABS: 20.0000 mg | ORAL_TABLET | Freq: Every day | ORAL | 4 refills | Status: DC

## 2020-09-12 MED ORDER — FLUOXETINE HCL 40 MG PO CAPS: 40.0000 mg | ORAL_CAPSULE | Freq: Every day | ORAL | 4 refills | Status: DC

## 2020-09-12 NOTE — Assessment & Plan Note (Signed)
Chronic, stable BP at goal in office on recent visits and at goal on checks at home.  Continue current medication regimen and adjust as needed, refills sent in.  BMP and TSH outpatient.   Recommend he focus on DASH diet at home and monitor BP regularly.  Return to office in 6 months for annual physical.

## 2020-09-12 NOTE — Progress Notes (Signed)
There were no vitals taken for this visit.   Subjective:    Patient ID: Peter Bennett, male    DOB: Jun 25, 1961, 60 y.o.   MRN: [id number]  HPI: Peter Bennett is a 60 y.o. male  Chief Complaint  Patient presents with  . Medication Refill    . This visit was completed via telephone due to the restrictions of the COVID-19 pandemic. All issues as above were discussed and addressed but no physical exam was performed. If it was felt that the patient should be evaluated in the office, they were directed there. The patient verbally consented to this visit. Patient was unable to complete an audio/visual visit due to Technical difficulties, Lack of internet. Due to the catastrophic nature of the COVID-19 pandemic, this visit was done through audio contact only. . Location of the patient: home . Location of the provider: work . Those involved with this call:  . Provider: Aura Dials, DNP . CMA: Tristan Schroeder, CMA . Front Desk/Registration: Harriet Pho  . Time spent on call: 21 minutes on the phone discussing health concerns. 15 minutes total spent in review of patient's record and preparation of their chart.  . I verified patient identity using two factors (patient name and date of birth). Patient consents verbally to being seen via telemedicine visit today.    HYPERTENSION / HYPERLIPIDEMIA Continues on Benazepril and Fish Oil.  Last LDL 168, discussed with him today statin use risks/benefits/side effects -- he agrees to recheck fasting labs in one week if elevation then start statin. Satisfied with current treatment? yes Duration of hypertension: chronic BP monitoring frequency: a few times a month BP range:  120/80 range BP medication side effects: no Duration of hyperlipidemia: chronic Cholesterol supplements: fish oil Medication compliance: good compliance Aspirin: yes Recent stressors: no Recurrent headaches: no Visual changes: no Palpitations: no Dyspnea:  no Chest pain: no Lower extremity edema: no Dizzy/lightheaded: no  The 10-year ASCVD risk score Denman George DC Jr., et al., 2013) is: 21.3%   Values used to calculate the score:     Age: 87 years     Sex: Male     Is Non-Hispanic African American: No     Diabetic: No     Tobacco smoker: No     Systolic Blood Pressure: 154 mmHg     Is BP treated: Yes     HDL Cholesterol: 35 mg/dL     Total Cholesterol: 234 mg/dL  DEPRESSION Continues on Prozac. Mood status: stable Satisfied with current treatment?: yes Symptom severity: mild  Duration of current treatment : chronic Side effects: no Medication compliance: good compliance Psychotherapy/counseling: none Depressed mood: no Anxious mood: no Anhedonia: no Significant weight loss or gain: no Insomnia: none Fatigue: no Feelings of worthlessness or guilt: no Impaired concentration/indecisiveness: no Suicidal ideations: no Hopelessness: no Crying spells: no Depression screen [facility] 2/9 09/12/2020 01/24/2020 01/12/2020 07/28/2019 10/20/2018  Decreased Interest 0 0 0 0 0  Down, Depressed, Hopeless 0 0 0 0 1  PHQ - 2 Score 0 0 0 0 1  Altered sleeping 0 0 0 0 2  Tired, decreased energy 0 2 3 2 1   Change in appetite 0 0 0 0 0  Feeling bad or failure about yourself  0 0 0 0 0  Trouble concentrating 0 0 0 0 0  Moving slowly or fidgety/restless 0 0 0 0 2  Suicidal thoughts 0 0 0 0 0  PHQ-9 Score 0 2 3 2 6   Difficult doing  work/chores - - - Not difficult at all -    Relevant past medical, surgical, family and social history reviewed and updated as indicated. Interim medical history since our last visit reviewed. Allergies and medications reviewed and updated.  Review of Systems  Constitutional: Negative for activity change, diaphoresis, fatigue and fever.  Respiratory: Negative for cough, chest tightness, shortness of breath and wheezing.   Cardiovascular: Negative for chest pain, palpitations and leg swelling.  Gastrointestinal: Negative.    Neurological: Negative.   Psychiatric/Behavioral: Negative.     Per HPI unless specifically indicated above     Objective:    There were no vitals taken for this visit.  Wt Readings from Last 3 Encounters:  07/30/20 210 lb (95.3 kg)  01/24/20 206 lb 6.4 oz (93.6 kg)  01/12/20 204 lb (92.5 kg)    Physical Exam   Unable to perform due to telephone visit only.  Results for orders placed or performed during the hospital encounter of 07/31/20  SARS CORONAVIRUS 2 (TAT 6-24 HRS) Nasopharyngeal Nasopharyngeal Swab   Specimen: Nasopharyngeal Swab  Result Value Ref Range   SARS Coronavirus 2 NEGATIVE NEGATIVE  Basic metabolic panel per protocol  Result Value Ref Range   Sodium 140 135 - 145 mmol/L   Potassium 4.2 3.5 - 5.1 mmol/L   Chloride 105 98 - 111 mmol/L   CO2 27 22 - 32 mmol/L   Glucose, Bld 96 70 - 99 mg/dL   BUN 20 8 - 23 mg/dL   Creatinine, Ser 7.02 0.61 - 1.24 mg/dL   Calcium 9.4 8.9 - 63.7 mg/dL   GFR, Estimated >85 >88 mL/min   Anion gap 8 5 - 15  CBC per protocol  Result Value Ref Range   WBC 6.2 4.0 - 10.5 K/uL   RBC 4.47 4.22 - 5.81 MIL/uL   Hemoglobin 13.7 13.0 - 17.0 g/dL   HCT 50.2 77.4 - 12.8 %   MCV 90.6 80.0 - 100.0 fL   MCH 30.6 26.0 - 34.0 pg   MCHC 33.8 30.0 - 36.0 g/dL   RDW 78.6 76.7 - 20.9 %   Platelets 200 150 - 400 K/uL   nRBC 0.0 0.0 - 0.2 %      Assessment & Plan:   Problem List Items Addressed This Visit      Cardiovascular and Mediastinum   Hypertension    Chronic, stable BP at goal in office on recent visits and at goal on checks at home.  Continue current medication regimen and adjust as needed, refills sent in.  BMP and TSH outpatient.   Recommend he focus on DASH diet at home and monitor BP regularly.  Return to office in 6 months for annual physical.      Relevant Medications   benazepril (LOTENSIN) 20 MG tablet   Other Relevant Orders   Basic metabolic panel   TSH     Other   Depression, recurrent (HCC) - Primary     Chronic, stable.  Denies SI/HI.  Will continue Prozac 40 MG daily, script sent, and recommend he use Melatonin nightly for sleep aide.  Return to office in 6 months for annual physical, sooner if any worsening mood.        Relevant Medications   FLUoxetine (PROZAC) 40 MG capsule   Hyperlipidemia    Chronic, ongoing with recent June labs noting ASCVD 21.3%.  Discussed with him today statin use risks/benefits/side effects -- he agrees to recheck fasting labs in one week if elevation then start  statin.   Lipid panel outpatient, focus heavily on diet changes at home.      Relevant Medications   benazepril (LOTENSIN) 20 MG tablet   Other Relevant Orders   Lipid Panel w/o Chol/HDL Ratio      I discussed the assessment and treatment plan with the patient. The patient was provided an opportunity to ask questions and all were answered. The patient agreed with the plan and demonstrated an understanding of the instructions.   The patient was advised to call back or seek an in-person evaluation if the symptoms worsen or if the condition fails to improve as anticipated.   I provided 21+ minutes of time during this encounter.  Follow up plan: Return in about 6 months (around 03/12/2021) for Annual physical.

## 2020-09-12 NOTE — Assessment & Plan Note (Signed)
Chronic, ongoing with recent June labs noting ASCVD 21.3%.  Discussed with him today statin use risks/benefits/side effects -- he agrees to recheck fasting labs in one week if elevation then start statin.   Lipid panel outpatient, focus heavily on diet changes at home.

## 2020-09-12 NOTE — Assessment & Plan Note (Signed)
Chronic, stable.  Denies SI/HI.  Will continue Prozac 40 MG daily, script sent, and recommend he use Melatonin nightly for sleep aide.  Return to office in 6 months for annual physical, sooner if any worsening mood.

## 2020-09-12 NOTE — Patient Instructions (Signed)
Preventing High Cholesterol Cholesterol is a white, waxy substance similar to fat that the human body needs to help build cells. The liver makes all the cholesterol that a person's body needs. Having high cholesterol (hypercholesterolemia) increases your risk for heart disease and stroke. Extra or excess cholesterol comes from the food that you eat. High cholesterol can often be prevented with diet and lifestyle changes. If you already have high cholesterol, you can control it with diet, lifestyle changes, and medicines. How can high cholesterol affect me? If you have high cholesterol, fatty deposits (plaques) may build up on the walls of your blood vessels. The blood vessels that carry blood away from your heart are called arteries. Plaques make the arteries narrower and stiffer. This in turn can:  Restrict or block blood flow and cause blood clots to form.  Increase your risk for heart attack and stroke. What can increase my risk for high cholesterol? This condition is more likely to develop in people who:  Eat foods that are high in saturated fat or cholesterol. Saturated fat is mostly found in foods that come from animal sources.  Are overweight.  Are not getting enough exercise.  Have a family history of high cholesterol (familial hypercholesterolemia). What actions can I take to prevent this? Nutrition  Eat less saturated fat.  Avoid trans fats (partially hydrogenated oils). These are often found in margarine and in some baked goods, fried foods, and snacks bought in packages.  Avoid precooked or cured meat, such as bacon, sausages, or meat loaves.  Avoid foods and drinks that have added sugars.  Eat more fruits, vegetables, and whole grains.  Choose healthy sources of protein, such as fish, poultry, lean cuts of red meat, beans, peas, lentils, and nuts.  Choose healthy sources of fat, such as: ? Nuts. ? Vegetable oils, especially olive oil. ? Fish that have healthy fats,  such as omega-3 fatty acids. These fish include mackerel or salmon.   Lifestyle  Lose weight if you are overweight. Maintaining a healthy body mass index (BMI) can help prevent or control high cholesterol. It can also lower your risk for diabetes and high blood pressure. Ask your health care provider to help you with a diet and exercise plan to lose weight safely.  Do not use any products that contain nicotine or tobacco, such as cigarettes, e-cigarettes, and chewing tobacco. If you need help quitting, ask your health care provider. Alcohol use  Do not drink alcohol if: ? Your health care provider tells you not to drink. ? You are pregnant, may be pregnant, or are planning to become pregnant.  If you drink alcohol: ? Limit how much you use to:  0-1 drink a day for women.  0-2 drinks a day for men. ? Be aware of how much alcohol is in your drink. In the [place] one drink equals one 12 oz bottle of beer (355 mL), one 5 oz glass of wine (148 mL), or one 1 oz glass of hard liquor (44 mL). Activity  Get enough exercise. Do exercises as told by your health care provider.  Each week, do at least 150 minutes of exercise that takes a medium level of effort (moderate-intensity exercise). This kind of exercise: ? Makes your heart beat faster while allowing you to still be able to talk. ? Can be done in short sessions several times a day or longer sessions a few times a week. For example, on 5 days each week, you could walk fast or ride   your bike 3 times a day for 10 minutes each time.   Medicines  Your health care provider may recommend medicines to help lower cholesterol. This may be a medicine to lower the amount of cholesterol that your liver makes. You may need medicine if: ? Diet and lifestyle changes have not lowered your cholesterol enough. ? You have high cholesterol and other risk factors for heart disease or stroke.  Take over-the-counter and prescription medicines only as told by your  health care provider. General information  Manage your risk factors for high cholesterol. Talk with your health care provider about all your risk factors and how to lower your risk.  Manage other conditions that you have, such as diabetes or high blood pressure (hypertension).  Have blood tests to check your cholesterol levels at regular points in time as told by your health care provider.  Keep all follow-up visits as told by your health care provider. This is important. Where to find more information  American Heart Association: www.heart.org  National Heart, Lung, and Blood Institute: www.nhlbi.nih.gov Summary  High cholesterol increases your risk for heart disease and stroke. By keeping your cholesterol level low, you can reduce your risk for these conditions.  High cholesterol can often be prevented with diet and lifestyle changes.  Work with your health care provider to manage your risk factors, and have your blood tested regularly. This information is not intended to replace advice given to you by your health care provider. Make sure you discuss any questions you have with your health care provider. Document Revised: 05/17/2019 Document Reviewed: 05/17/2019 Elsevier Patient Education  2021 Elsevier Inc.  

## 2020-09-13 ENCOUNTER — Encounter: Payer: 59 | Admitting: Nurse Practitioner

## 2020-09-25 ENCOUNTER — Other Ambulatory Visit: Payer: Self-pay

## 2020-09-25 ENCOUNTER — Other Ambulatory Visit: Payer: 59

## 2020-09-25 DIAGNOSIS — E782 Mixed hyperlipidemia: Secondary | ICD-10-CM

## 2020-09-25 DIAGNOSIS — I10 Essential (primary) hypertension: Secondary | ICD-10-CM

## 2020-09-26 LAB — BASIC METABOLIC PANEL
BUN/Creatinine Ratio: 16 (ref 10–24)
BUN: 14 mg/dL (ref 8–27)
CO2: 24 mmol/L (ref 20–29)
Calcium: 9.2 mg/dL (ref 8.6–10.2)
Chloride: 101 mmol/L (ref 96–106)
Creatinine, Ser: 0.9 mg/dL (ref 0.76–1.27)
GFR calc Af Amer: 106 mL/min/{1.73_m2} (ref >59)
GFR calc non Af Amer: 92 mL/min/{1.73_m2} (ref >59)
Glucose: 88 mg/dL (ref 65–99)
Potassium: 4.2 mmol/L (ref 3.5–5.2)
Sodium: 139 mmol/L (ref 134–144)

## 2020-09-26 LAB — LIPID PANEL W/O CHOL/HDL RATIO
Cholesterol, Total: 235 mg/dL — ABNORMAL HIGH (ref 100–199)
HDL: 29 mg/dL — ABNORMAL LOW (ref >39)
LDL Chol Calc (NIH): 160 mg/dL — ABNORMAL HIGH (ref 0–99)
Triglycerides: 247 mg/dL — ABNORMAL HIGH (ref 0–149)
VLDL Cholesterol Cal: 46 mg/dL — ABNORMAL HIGH (ref 5–40)

## 2020-09-26 LAB — TSH: TSH: 4.07 u[IU]/mL (ref 0.450–4.500)

## 2020-09-26 NOTE — Progress Notes (Signed)
Contacted via MyChart The 10-year ASCVD risk score Denman George DC Jr., et al., 2013) is: 24%   Values used to calculate the score:     Age: 60 years     Sex: Male     Is Non-Hispanic African American: No     Diabetic: No     Tobacco smoker: No     Systolic Blood Pressure: 154 mmHg     Is BP treated: Yes     HDL Cholesterol: 29 mg/dL     Total Cholesterol: 235 mg/dL   Good evening Mr. Peter Bennett, your labs have returned.   - Thyroid is normal - Kidney function and electrolytes normal - Cholesterol levels are still quite elevated.  With LDL, bad cholesterol, at 160 and total cholesterol 235.  This places your cardiac risk score on calculation at 24% in next 10 years.  At this level I do recommend we start a low dose statin to help with prevention as goal for stroke prevention is LDL less then 70.  If you are okay with starting this please let me know via message and I will send in script.  Any questions or concerns? Keep being amazing!!  Thank you for allowing me to participate in your care. Kindest regards, Phong Isenberg

## 2020-09-28 ENCOUNTER — Other Ambulatory Visit: Payer: Self-pay | Admitting: Nurse Practitioner

## 2020-09-28 MED ORDER — ATORVASTATIN CALCIUM 10 MG PO TABS: 10.0000 mg | ORAL_TABLET | Freq: Every day | ORAL | 3 refills | Status: DC

## 2020-10-29 ENCOUNTER — Telehealth: Payer: Self-pay | Admitting: Internal Medicine

## 2020-10-29 NOTE — Telephone Encounter (Signed)
Pt called stating that the medication, atorvastatin, that was recently prescribed to pt is causing him to have side effects. He states that he is having headache,sinus pressure, and achy joints. Pt states that he would like to try another medication. Please advise.      [facility] - [place], [place] - [address]  [address] [place] [address]  Phone: [phone] Fax: [phone]  Hours: Not open 24 hours

## 2020-11-02 ENCOUNTER — Other Ambulatory Visit: Payer: Self-pay | Admitting: Nurse Practitioner

## 2020-11-02 MED ORDER — ROSUVASTATIN CALCIUM 10 MG PO TABS: ORAL_TABLET | ORAL | 3 refills | Status: DC

## 2020-11-02 NOTE — Telephone Encounter (Signed)
I have sent in Crestor and written for it to be taken 3 days a week to see if tolerance with this.  If unable to tolerate we will note this.  Would like him to visit office in 2 weeks for follow-up.

## 2020-11-02 NOTE — Telephone Encounter (Signed)
Called and notified patient of medication change. Patient states he is not at home and will call back to schedule due to having multiple PT appointment coming up

## 2020-11-02 NOTE — Telephone Encounter (Signed)
Routing to provider who saw the patient.  

## 2021-06-07 ENCOUNTER — Ambulatory Visit (INDEPENDENT_AMBULATORY_CARE_PROVIDER_SITE_OTHER): Payer: 59 | Admitting: Podiatry

## 2021-06-07 ENCOUNTER — Other Ambulatory Visit: Payer: Self-pay

## 2021-06-07 DIAGNOSIS — M2042 Other hammer toe(s) (acquired), left foot: Secondary | ICD-10-CM

## 2021-06-07 DIAGNOSIS — M2041 Other hammer toe(s) (acquired), right foot: Secondary | ICD-10-CM | POA: Diagnosis not present

## 2021-06-07 DIAGNOSIS — L989 Disorder of the skin and subcutaneous tissue, unspecified: Secondary | ICD-10-CM | POA: Diagnosis not present

## 2021-06-07 DIAGNOSIS — M7752 Other enthesopathy of left foot: Secondary | ICD-10-CM | POA: Diagnosis not present

## 2021-06-07 DIAGNOSIS — L600 Ingrowing nail: Secondary | ICD-10-CM | POA: Diagnosis not present

## 2021-06-07 MED ORDER — MELOXICAM 15 MG PO TABS: 15.0000 mg | ORAL_TABLET | Freq: Every day | ORAL | 1 refills | Status: DC

## 2021-06-07 MED ORDER — BETAMETHASONE SOD PHOS & ACET 6 (3-3) MG/ML IJ SUSP
3.0000 mg | Freq: Once | INTRAMUSCULAR | Status: AC
  Administered 2021-06-07: 3 mg via INTRA_ARTICULAR

## 2021-06-07 MED ORDER — GENTAMICIN SULFATE 0.1 % EX CREA: 1.0000 "application " | TOPICAL_CREAM | Freq: Two times a day (BID) | CUTANEOUS | 1 refills | Status: DC

## 2021-06-07 NOTE — Progress Notes (Signed)
Subjective: Patient presents today for evaluation of pain to the medial border left great toe. Patient is concerned for possible ingrown nail.  It is very sensitive to touch.    Patient also states that he has been diagnosed in the past with hammertoes.  He has significant pain and tenderness to the fifth toe joint of the left foot.  Under the ball of the foot it is exquisitely painful.  He states that he has significant pain with ambulation during gait.  He has tried different shoes with minimal improvement patient presents today for further treatment and evaluation.  Past Medical History:  Diagnosis Date   Allergy    Depression    Hyperlipidemia    Hypertension    Osteoarthritis    Sleep apnea     Objective:  General: Well developed, nourished, in no acute distress, alert and oriented x3   Dermatology: Skin is warm, dry and supple bilateral.  Medial border left great toe appears to be erythematous with evidence of an ingrowing nail. Pain on palpation noted to the border of the nail fold. The remaining nails appear unremarkable at this time. There are no open sores, lesions.  There is also hyperkeratotic symptomatic skin lesion noted to the right fifth toe around the nail plate.  Vascular: Dorsalis Pedis artery and Posterior Tibial artery pedal pulses palpable. No lower extremity edema noted.   Neruologic: Grossly intact via light touch bilateral.  Musculoskeletal: Muscular strength within normal limits in all groups bilateral. Normal range of motion noted to all pedal and ankle joints.  Pain on palpation range of motion of the fifth MTP joint and metatarsal head of the fifth metatarsal left Hammertoe deformity also noted to digits 1-5 bilateral.  This creates a prominent ball of the foot and generalized pain/metatarsalgia throughout the foot  Assesement: #1 Paronychia with ingrowing nail medial border left great toe #2  Fifth MTPJ capsulitis bilateral #3 hammertoes 1-5  bilateral #4 symptomatic callus right fifth toe  Plan of Care:  1. Patient evaluated.  2. Discussed treatment alternatives and plan of care. Explained nail avulsion procedure and post procedure course to patient. 3. Patient opted for permanent partial nail avulsion of the ingrown portion of the nail.  4. Prior to procedure, local anesthesia infiltration utilized using 3 ml of a 50:50 mixture of 2% plain lidocaine and 0.5% plain marcaine in a normal hallux block fashion and a betadine prep performed.  5. Partial permanent nail avulsion with chemical matrixectomy performed using 3x30sec applications of phenol followed by alcohol flush.  6. Light dressing applied.  Post care instructions provided 7.  Prescription for gentamicin 2% cream  8.  Injection of 0.5 cc Celestone Soluspan injected the fifth MTPJ left  9.  Light debridement of the callus lesion was performed to the right fifth toe  10.  Prescription for meloxicam 15 mg daily  11.  Silicone toe caps dispensed for the right fifth toe  12.  OTC power step insoles were also provided wear daily  13.  Return to clinic 2 weeks.  Felecia Shelling, DPM [facility]  Dr. Felecia Shelling, DPM    [address], [place] [address]  Office [phone]  Fax [phone]

## 2021-06-10 ENCOUNTER — Ambulatory Visit (INDEPENDENT_AMBULATORY_CARE_PROVIDER_SITE_OTHER): Payer: 59 | Admitting: Internal Medicine

## 2021-06-10 ENCOUNTER — Encounter: Payer: Self-pay | Admitting: Internal Medicine

## 2021-06-10 DIAGNOSIS — E785 Hyperlipidemia, unspecified: Secondary | ICD-10-CM | POA: Insufficient documentation

## 2021-06-10 DIAGNOSIS — I10 Essential (primary) hypertension: Secondary | ICD-10-CM | POA: Diagnosis not present

## 2021-06-10 MED ORDER — VENLAFAXINE HCL ER 150 MG PO CP24: 150.0000 mg | ORAL_CAPSULE | Freq: Every day | ORAL | 1 refills | Status: DC

## 2021-06-10 NOTE — Progress Notes (Signed)
There were no vitals taken for this visit.   Subjective:    Patient ID: Peter Bennett, male    DOB: Apr 19, 1961, 61 y.o.   MRN: [id number]  Chief Complaint  Patient presents with   Hypertension   Depression    HPI: Peter Bennett is a 60 y.o. male  Pt is here for a follow up   This visit was completed via telephone due to the restrictions of the COVID-19 pandemic. All issues as above were discussed and addressed but no physical exam was performed. If it was felt that the patient should be evaluated in the office, they were directed there. The patient verbally consented to this visit. Patient was unable to complete an audio/visual visit due to Technical difficulties. Due to the catastrophic nature of the COVID-19 pandemic, this visit was done through audio contact only. Location of the patient: home Location of the provider: home Those involved with this call:  Provider: Loura Pardon, MD CMA: Tristan Schroeder, CMA Front Desk/Registration: Ozella Almond  Time spent on call:15 minutes on the phone discussing health concerns.15 minutes total spent in review of patient's record and preparation of their chart.    Hypertension This is a chronic problem. The current episode started more than 1 year ago. The problem is controlled. Associated symptoms include anxiety. Pertinent negatives include no blurred vision, headaches, malaise/fatigue, neck pain, orthopnea, peripheral edema, PND, shortness of breath or sweats.  Depression        This is a chronic problem.  The current episode started 1 to 4 weeks ago.   Associated symptoms include no decreased concentration, no helplessness, no hopelessness, does not have insomnia, not irritable, no decreased interest, no appetite change, no body aches, no myalgias, no headaches, not sad and no suicidal ideas.  Past medical history includes anxiety.   Anxiety Visit type: wsa on effexor changed to prozac now, dose is working well , SAW - sleep  appetite and weight wnl. no. Patient reports no decreased concentration, insomnia, shortness of breath or suicidal ideas.     Chief Complaint  Patient presents with   Hypertension   Depression    Relevant past medical, surgical, family and social history reviewed and updated as indicated. Interim medical history since our last visit reviewed. Allergies and medications reviewed and updated.  Review of Systems  Constitutional:  Negative for appetite change and malaise/fatigue.  Eyes:  Negative for blurred vision.  Respiratory:  Negative for shortness of breath.   Cardiovascular:  Negative for orthopnea and PND.  Musculoskeletal:  Negative for myalgias and neck pain.  Neurological:  Negative for headaches.  Psychiatric/Behavioral:  Positive for depression. Negative for decreased concentration and suicidal ideas. The patient does not have insomnia.    Per HPI unless specifically indicated above     Objective:    There were no vitals taken for this visit.  Wt Readings from Last 3 Encounters:  07/30/20 210 lb (95.3 kg)  01/24/20 206 lb 6.4 oz (93.6 kg)  01/12/20 204 lb (92.5 kg)    Physical Exam Constitutional:      General: He is not irritable.   Results for orders placed or performed in visit on 09/25/20  TSH  Result Value Ref Range   TSH 4.070 0.450 - 4.500 uIU/mL  Lipid Panel w/o Chol/HDL Ratio  Result Value Ref Range   Cholesterol, Total 235 (H) 100 - 199 mg/dL   Triglycerides 250 (H) 0 - 149 mg/dL   HDL 29 (L) >03 mg/dL  VLDL Cholesterol Cal 46 (H) 5 - 40 mg/dL   LDL Chol Calc (NIH) 277 (H) 0 - 99 mg/dL  Basic metabolic panel  Result Value Ref Range   Glucose 88 65 - 99 mg/dL   BUN 14 8 - 27 mg/dL   Creatinine, Ser 8.24 0.76 - 1.27 mg/dL   GFR calc non Af Amer 92 >59 mL/min/1.73   GFR calc Af Amer 106 >59 mL/min/1.73   BUN/Creatinine Ratio 16 10 - 24   Sodium 139 134 - 144 mmol/L   Potassium 4.2 3.5 - 5.2 mmol/L   Chloride 101 96 - 106 mmol/L   CO2 24 20 -  29 mmol/L   Calcium 9.2 8.6 - 10.2 mg/dL        Current Outpatient Medications:    acetaminophen (TYLENOL) 500 MG tablet, Take 2 tablets (1,000 mg total) by mouth every 8 (eight) hours., Disp: 90 tablet, Rfl: 2   aspirin 81 MG tablet, Take 81 mg by mouth daily., Disp: , Rfl:    benazepril (LOTENSIN) 20 MG tablet, Take 1 tablet (20 mg total) by mouth daily., Disp: 90 tablet, Rfl: 4   Cetirizine HCl 10 MG CAPS, Take 10 mg by mouth daily. , Disp: , Rfl:    Cholecalciferol 25 MCG (1000 UT) capsule, Take 1,000 Units by mouth daily. , Disp: , Rfl:    cyclobenzaprine (FLEXERIL) 10 MG tablet, Take 1 tablet (10 mg total) by mouth 3 (three) times daily as needed for muscle spasms., Disp: 30 tablet, Rfl: 1   FLUoxetine (PROZAC) 40 MG capsule, Take 1 capsule (40 mg total) by mouth daily., Disp: 90 capsule, Rfl: 4   fluticasone (FLONASE) 50 MCG/ACT nasal spray, Place 1 spray into both nostrils daily as needed for allergies or rhinitis., Disp: , Rfl:    gentamicin cream (GARAMYCIN) 0.1 %, Apply 1 application topically 2 (two) times daily., Disp: 30 g, Rfl: 1   Glucosamine-Chondroitin-MSM 500-400-167 MG TABS, Take 2 tablets by mouth daily. , Disp: , Rfl:    meloxicam (MOBIC) 15 MG tablet, Take 1 tablet (15 mg total) by mouth daily., Disp: 30 tablet, Rfl: 1   naproxen sodium (ALEVE) 220 MG tablet, Take 220 mg by mouth 2 (two) times daily as needed., Disp: , Rfl:    Omega-3 Fatty Acids (FISH OIL) 1000 MG CAPS, Take 1,000 mg by mouth daily. , Disp: , Rfl:    ondansetron (ZOFRAN ODT) 4 MG disintegrating tablet, Take 1 tablet (4 mg total) by mouth every 8 (eight) hours as needed for nausea or vomiting., Disp: 20 tablet, Rfl: 0   oxyCODONE (ROXICODONE) 5 MG immediate release tablet, Take 1-2 tablets (5-10 mg total) by mouth every 4 (four) hours as needed (pain)., Disp: 30 tablet, Rfl: 0   rosuvastatin (CRESTOR) 10 MG tablet, Take one tablet (10 MG) by mouth three days a week., Disp: 48 tablet, Rfl: 3    triamcinolone cream (KENALOG) 0.1 %, Apply 1 application topically 2 (two) times daily. (Patient taking differently: Apply 1 application topically 2 (two) times daily as needed (irritation).), Disp: 30 g, Rfl: 0    Assessment & Plan:   Htn HTN :  Continue current meds.  Medication compliance emphasised. pt advised to keep Bp logs. Pt verbalised understanding of the same. Pt to have a low salt diet . Exercise to reach a goal of at least 150 mins a week.  lifestyle modifications explained and pt understands importance of the above.  2.  Anxiety:  Stable,on prozac for such  3. HLD  Needs labs, none since februalry To come in tommorow for such.  Fu with me in 1-2 weeks.    Problem List Items Addressed This Visit       Cardiovascular and Mediastinum   Hypertension   Relevant Orders   Lipid panel   CBC with Differential/Platelet   Comprehensive metabolic panel   Urinalysis, Routine w reflex microscopic   Other Visit Diagnoses     Hyperlipidemia with target LDL less than 130    -  Primary   Relevant Orders   Lipid panel   CBC with Differential/Platelet   Comprehensive metabolic panel   Urinalysis, Routine w reflex microscopic        Orders Placed This Encounter  Procedures   Lipid panel   CBC with Differential/Platelet   Comprehensive metabolic panel   Urinalysis, Routine w reflex microscopic     Meds ordered this encounter  Medications   DISCONTD: venlafaxine XR (EFFEXOR-XR) 150 MG 24 hr capsule    Sig: Take 1 capsule (150 mg total) by mouth daily.    Dispense:  30 capsule    Refill:  1     Follow up plan: No follow-ups on file.  Labs 1 week prior to next visit.

## 2021-06-12 ENCOUNTER — Other Ambulatory Visit: Payer: Self-pay

## 2021-06-12 ENCOUNTER — Other Ambulatory Visit: Payer: 59

## 2021-06-12 DIAGNOSIS — E785 Hyperlipidemia, unspecified: Secondary | ICD-10-CM

## 2021-06-12 DIAGNOSIS — I10 Essential (primary) hypertension: Secondary | ICD-10-CM

## 2021-06-12 LAB — URINALYSIS, ROUTINE W REFLEX MICROSCOPIC
Bilirubin, UA: NEGATIVE
Glucose, UA: NEGATIVE
Ketones, UA: NEGATIVE
Leukocytes,UA: NEGATIVE
Nitrite, UA: NEGATIVE
Protein,UA: NEGATIVE
RBC, UA: NEGATIVE
Specific Gravity, UA: 1.025 (ref 1.005–1.030)
Urobilinogen, Ur: 0.2 mg/dL (ref 0.2–1.0)
pH, UA: 5.5 (ref 5.0–7.5)

## 2021-06-13 LAB — COMPREHENSIVE METABOLIC PANEL
ALT: 21 IU/L (ref 0–44)
AST: 17 IU/L (ref 0–40)
Albumin/Globulin Ratio: 1.7 (ref 1.2–2.2)
Albumin: 4.1 g/dL (ref 3.8–4.8)
Alkaline Phosphatase: 70 IU/L (ref 44–121)
BUN/Creatinine Ratio: 15 (ref 10–24)
BUN: 14 mg/dL (ref 8–27)
Bilirubin Total: 0.4 mg/dL (ref 0.0–1.2)
CO2: 25 mmol/L (ref 20–29)
Calcium: 9.2 mg/dL (ref 8.6–10.2)
Chloride: 103 mmol/L (ref 96–106)
Creatinine, Ser: 0.93 mg/dL (ref 0.76–1.27)
Globulin, Total: 2.4 g/dL (ref 1.5–4.5)
Glucose: 98 mg/dL (ref 70–99)
Potassium: 4.9 mmol/L (ref 3.5–5.2)
Sodium: 140 mmol/L (ref 134–144)
Total Protein: 6.5 g/dL (ref 6.0–8.5)
eGFR: 93 mL/min/{1.73_m2} (ref >59)

## 2021-06-13 LAB — CBC WITH DIFFERENTIAL/PLATELET
Basophils Absolute: 0.1 10*3/uL (ref 0.0–0.2)
Basos: 1 %
EOS (ABSOLUTE): 0.3 10*3/uL (ref 0.0–0.4)
Eos: 4 %
Hematocrit: 38.9 % (ref 37.5–51.0)
Hemoglobin: 13 g/dL (ref 13.0–17.7)
Immature Grans (Abs): 0 10*3/uL (ref 0.0–0.1)
Immature Granulocytes: 0 %
Lymphocytes Absolute: 1.4 10*3/uL (ref 0.7–3.1)
Lymphs: 22 %
MCH: 30.6 pg (ref 26.6–33.0)
MCHC: 33.4 g/dL (ref 31.5–35.7)
MCV: 92 fL (ref 79–97)
Monocytes Absolute: 0.6 10*3/uL (ref 0.1–0.9)
Monocytes: 9 %
Neutrophils Absolute: 4.1 10*3/uL (ref 1.4–7.0)
Neutrophils: 64 %
Platelets: 177 10*3/uL (ref 150–450)
RBC: 4.25 x10E6/uL (ref 4.14–5.80)
RDW: 12.7 % (ref 11.6–15.4)
WBC: 6.4 10*3/uL (ref 3.4–10.8)

## 2021-06-13 LAB — LIPID PANEL
Chol/HDL Ratio: 3.8 ratio (ref 0.0–5.0)
Cholesterol, Total: 143 mg/dL (ref 100–199)
HDL: 38 mg/dL — ABNORMAL LOW (ref >39)
LDL Chol Calc (NIH): 85 mg/dL (ref 0–99)
Triglycerides: 111 mg/dL (ref 0–149)
VLDL Cholesterol Cal: 20 mg/dL (ref 5–40)

## 2021-06-20 ENCOUNTER — Other Ambulatory Visit: Payer: 59

## 2021-06-20 ENCOUNTER — Encounter: Payer: Self-pay | Admitting: Internal Medicine

## 2021-06-20 ENCOUNTER — Other Ambulatory Visit: Payer: Self-pay

## 2021-06-20 ENCOUNTER — Ambulatory Visit (INDEPENDENT_AMBULATORY_CARE_PROVIDER_SITE_OTHER): Payer: 59 | Admitting: Internal Medicine

## 2021-06-20 VITALS — BP 118/73 | HR 61 | Temp 97.9°F | Ht 70.08 in | Wt 212.4 lb

## 2021-06-20 DIAGNOSIS — Z23 Encounter for immunization: Secondary | ICD-10-CM

## 2021-06-20 DIAGNOSIS — R413 Other amnesia: Secondary | ICD-10-CM | POA: Diagnosis not present

## 2021-06-20 DIAGNOSIS — I10 Essential (primary) hypertension: Secondary | ICD-10-CM | POA: Diagnosis not present

## 2021-06-20 DIAGNOSIS — N529 Male erectile dysfunction, unspecified: Secondary | ICD-10-CM | POA: Diagnosis not present

## 2021-06-20 DIAGNOSIS — E782 Mixed hyperlipidemia: Secondary | ICD-10-CM | POA: Diagnosis not present

## 2021-06-20 DIAGNOSIS — Z1211 Encounter for screening for malignant neoplasm of colon: Secondary | ICD-10-CM | POA: Insufficient documentation

## 2021-06-20 NOTE — Progress Notes (Signed)
BP 118/73   Pulse 61   Temp 97.9 F (36.6 C) (Oral)   Ht 5' 10.08" (1.78 m)   Wt 212 lb 6.4 oz (96.3 kg)   SpO2 98%   BMI 30.41 kg/m    Subjective:    Patient ID: Peter Bennett, male    DOB: 20-Aug-1960, 60 y.o.   MRN: [id number]  Chief Complaint  Patient presents with   Hyperlipidemia   Hypertension   Depression    HPI: Peter Bennett is a 60 y.o. male  Has had a problem focusing and remembering things that are important more so for short term memory per him.   Hyperlipidemia This is a chronic problem. The problem is controlled. Pertinent negatives include no chest pain, focal sensory loss, focal weakness, leg pain or myalgias.  Hypertension This is a chronic problem. The problem has been rapidly improving since onset. The problem is controlled. Pertinent negatives include no anxiety, blurred vision, chest pain, headaches, malaise/fatigue or neck pain.  Depression        This is a chronic problem.  The current episode started 1 to 4 weeks ago.   Associated symptoms include no myalgias and no headaches.   Pertinent negatives include no anxiety.  Chief Complaint  Patient presents with   Hyperlipidemia   Hypertension   Depression    Relevant past medical, surgical, family and social history reviewed and updated as indicated. Interim medical history since our last visit reviewed. Allergies and medications reviewed and updated.  Review of Systems  Constitutional:  Negative for malaise/fatigue.  Eyes:  Negative for blurred vision.  Cardiovascular:  Negative for chest pain.  Musculoskeletal:  Negative for myalgias and neck pain.  Neurological:  Negative for focal weakness and headaches.  Psychiatric/Behavioral:  Positive for depression.    Per HPI unless specifically indicated above     Objective:    BP 118/73   Pulse 61   Temp 97.9 F (36.6 C) (Oral)   Ht 5' 10.08" (1.78 m)   Wt 212 lb 6.4 oz (96.3 kg)   SpO2 98%   BMI 30.41 kg/m   Wt Readings from  Last 3 Encounters:  06/20/21 212 lb 6.4 oz (96.3 kg)  07/30/20 210 lb (95.3 kg)  01/24/20 206 lb 6.4 oz (93.6 kg)    Physical Exam Vitals and nursing note reviewed.  Constitutional:      General: He is not in acute distress.    Appearance: Normal appearance. He is not ill-appearing or diaphoretic.  HENT:     Head: Normocephalic and atraumatic.     Right Ear: Tympanic membrane and external ear normal. There is no impacted cerumen.     Left Ear: External ear normal.     Nose: No congestion or rhinorrhea.     Mouth/Throat:     Pharynx: No oropharyngeal exudate or posterior oropharyngeal erythema.  Eyes:     Conjunctiva/sclera: Conjunctivae normal.     Pupils: Pupils are equal, round, and reactive to light.  Cardiovascular:     Rate and Rhythm: Normal rate and regular rhythm.     Heart sounds: No murmur heard.   No friction rub. No gallop.  Pulmonary:     Effort: No respiratory distress.     Breath sounds: No stridor. No wheezing or rhonchi.  Chest:     Chest wall: No tenderness.  Abdominal:     General: Abdomen is flat. Bowel sounds are normal.     Palpations: Abdomen is soft. There is  no mass.     Tenderness: There is no abdominal tenderness.  Musculoskeletal:     Cervical back: Normal range of motion and neck supple. No rigidity or tenderness.     Left lower leg: No edema.  Skin:    General: Skin is warm and dry.  Neurological:     Mental Status: He is alert.     Cranial Nerves: No cranial nerve deficit.     Sensory: No sensory deficit.     Motor: No weakness.     Coordination: Coordination normal.     Gait: Gait normal.    Results for orders placed or performed in visit on 06/12/21  Urinalysis, Routine w reflex microscopic  Result Value Ref Range   Specific Gravity, UA 1.025 1.005 - 1.030   pH, UA 5.5 5.0 - 7.5   Color, UA Yellow Yellow   Appearance Ur Clear Clear   Leukocytes,UA Negative Negative   Protein,UA Negative Negative/Trace   Glucose, UA Negative  Negative   Ketones, UA Negative Negative   RBC, UA Negative Negative   Bilirubin, UA Negative Negative   Urobilinogen, Ur 0.2 0.2 - 1.0 mg/dL   Nitrite, UA Negative Negative  Comprehensive metabolic panel  Result Value Ref Range   Glucose 98 70 - 99 mg/dL   BUN 14 8 - 27 mg/dL   Creatinine, Ser 0.93 0.76 - 1.27 mg/dL   eGFR 93 >59 mL/min/1.73   BUN/Creatinine Ratio 15 10 - 24   Sodium 140 134 - 144 mmol/L   Potassium 4.9 3.5 - 5.2 mmol/L   Chloride 103 96 - 106 mmol/L   CO2 25 20 - 29 mmol/L   Calcium 9.2 8.6 - 10.2 mg/dL   Total Protein 6.5 6.0 - 8.5 g/dL   Albumin 4.1 3.8 - 4.8 g/dL   Globulin, Total 2.4 1.5 - 4.5 g/dL   Albumin/Globulin Ratio 1.7 1.2 - 2.2   Bilirubin Total 0.4 0.0 - 1.2 mg/dL   Alkaline Phosphatase 70 44 - 121 IU/L   AST 17 0 - 40 IU/L   ALT 21 0 - 44 IU/L  CBC with Differential/Platelet  Result Value Ref Range   WBC 6.4 3.4 - 10.8 x10E3/uL   RBC 4.25 4.14 - 5.80 x10E6/uL   Hemoglobin 13.0 13.0 - 17.7 g/dL   Hematocrit 38.9 37.5 - 51.0 %   MCV 92 79 - 97 fL   MCH 30.6 26.6 - 33.0 pg   MCHC 33.4 31.5 - 35.7 g/dL   RDW 12.7 11.6 - 15.4 %   Platelets 177 150 - 450 x10E3/uL   Neutrophils 64 Not Estab. %   Lymphs 22 Not Estab. %   Monocytes 9 Not Estab. %   Eos 4 Not Estab. %   Basos 1 Not Estab. %   Neutrophils Absolute 4.1 1.4 - 7.0 x10E3/uL   Lymphocytes Absolute 1.4 0.7 - 3.1 x10E3/uL   Monocytes Absolute 0.6 0.1 - 0.9 x10E3/uL   EOS (ABSOLUTE) 0.3 0.0 - 0.4 x10E3/uL   Basophils Absolute 0.1 0.0 - 0.2 x10E3/uL   Immature Granulocytes 0 Not Estab. %   Immature Grans (Abs) 0.0 0.0 - 0.1 x10E3/uL  Lipid panel  Result Value Ref Range   Cholesterol, Total 143 100 - 199 mg/dL   Triglycerides 111 0 - 149 mg/dL   HDL 38 (L) >39 mg/dL   VLDL Cholesterol Cal 20 5 - 40 mg/dL   LDL Chol Calc (NIH) 85 0 - 99 mg/dL   Chol/HDL Ratio 3.8 0.0 - 5.0  ratio        Current Outpatient Medications:    aspirin 81 MG tablet, Take 81 mg by mouth daily., Disp: ,  Rfl:    benazepril (LOTENSIN) 20 MG tablet, Take 1 tablet (20 mg total) by mouth daily., Disp: 90 tablet, Rfl: 4   Cholecalciferol 25 MCG (1000 UT) capsule, Take 1,000 Units by mouth daily. , Disp: , Rfl:    FLUoxetine (PROZAC) 40 MG capsule, Take 1 capsule (40 mg total) by mouth daily., Disp: 90 capsule, Rfl: 4   fluticasone (FLONASE) 50 MCG/ACT nasal spray, Place 1 spray into both nostrils daily as needed for allergies or rhinitis., Disp: , Rfl:    Omega-3 Fatty Acids (FISH OIL) 1000 MG CAPS, Take 1,000 mg by mouth daily. , Disp: , Rfl:    rosuvastatin (CRESTOR) 10 MG tablet, Take one tablet (10 MG) by mouth three days a week., Disp: 48 tablet, Rfl: 3   acetaminophen (TYLENOL) 500 MG tablet, Take 2 tablets (1,000 mg total) by mouth every 8 (eight) hours. (Patient not taking: Reported on 06/20/2021), Disp: 90 tablet, Rfl: 2    Assessment & Plan:  HLD is on crestor for such recheck FLP, check LFT's work on diet, SE of meds explained to pt. low fat and high fiber diet explained to pt.   Ref. Range 09/25/2020 09:52 06/12/2021 09:01 06/12/2021 09:06  Total CHOL/HDL Ratio Latest Ref Range: 0.0 - 5.0 ratio   3.8  Cholesterol, Total Latest Ref Range: 100 - 199 mg/dL 235 (H)  143  HDL Cholesterol Latest Ref Range: >39 mg/dL 29 (L)  38 (L)  Triglycerides Latest Ref Range: 0 - 149 mg/dL 247 (H)  111  VLDL Cholesterol Cal Latest Ref Range: 5 - 40 mg/dL 46 (H)  20  LDL Chol Calc (NIH) Latest Ref Range: 0 - 99 mg/dL 160 (H)  85   2. Anxiety / depression is on prozac for such , was on effexor for such  Stable.   3. Dementia: Will refer to neurology   check B12, folate, TSH, RPR.  Patient will be referred to Neurology  appropriate paperwork given to caregiver with information regarding the care for patient with dementia. Patient probably has had severe dementia for a while now. Not been treated for such.     Problem List Items Addressed This Visit   None    Orders Placed This Encounter   Procedures   Cologuard   Ambulatory referral to Neurology     No orders of the defined types were placed in this encounter.    Follow up plan: No follow-ups on file.  Health Maintenance : PSA : 2022. Cscope :Cologaurd : ordered.  Pneumonia vaccine : prenvanar today

## 2021-06-21 ENCOUNTER — Encounter: Payer: Self-pay | Admitting: Podiatry

## 2021-06-21 ENCOUNTER — Ambulatory Visit: Payer: 59 | Admitting: Podiatry

## 2021-06-21 DIAGNOSIS — L600 Ingrowing nail: Secondary | ICD-10-CM

## 2021-06-21 DIAGNOSIS — M7752 Other enthesopathy of left foot: Secondary | ICD-10-CM

## 2021-06-21 LAB — LIPID PANEL W/O CHOL/HDL RATIO
Cholesterol, Total: 166 mg/dL (ref 100–199)
HDL: 38 mg/dL — ABNORMAL LOW (ref >39)
LDL Chol Calc (NIH): 109 mg/dL — ABNORMAL HIGH (ref 0–99)
Triglycerides: 105 mg/dL (ref 0–149)
VLDL Cholesterol Cal: 19 mg/dL (ref 5–40)

## 2021-06-21 LAB — COMPREHENSIVE METABOLIC PANEL
ALT: 23 IU/L (ref 0–44)
AST: 22 IU/L (ref 0–40)
Albumin/Globulin Ratio: 2.2 (ref 1.2–2.2)
Albumin: 4.4 g/dL (ref 3.8–4.8)
Alkaline Phosphatase: 68 IU/L (ref 44–121)
BUN/Creatinine Ratio: 22 (ref 10–24)
BUN: 19 mg/dL (ref 8–27)
Bilirubin Total: 0.5 mg/dL (ref 0.0–1.2)
CO2: 25 mmol/L (ref 20–29)
Calcium: 9.1 mg/dL (ref 8.6–10.2)
Chloride: 103 mmol/L (ref 96–106)
Creatinine, Ser: 0.88 mg/dL (ref 0.76–1.27)
Globulin, Total: 2 g/dL (ref 1.5–4.5)
Glucose: 70 mg/dL (ref 70–99)
Potassium: 4.5 mmol/L (ref 3.5–5.2)
Sodium: 140 mmol/L (ref 134–144)
Total Protein: 6.4 g/dL (ref 6.0–8.5)
eGFR: 97 mL/min/{1.73_m2} (ref >59)

## 2021-06-21 LAB — CBC WITH DIFFERENTIAL/PLATELET
Basophils Absolute: 0.1 10*3/uL (ref 0.0–0.2)
Basos: 1 %
EOS (ABSOLUTE): 0.3 10*3/uL (ref 0.0–0.4)
Eos: 5 %
Hematocrit: 39.3 % (ref 37.5–51.0)
Hemoglobin: 12.9 g/dL — ABNORMAL LOW (ref 13.0–17.7)
Immature Grans (Abs): 0 10*3/uL (ref 0.0–0.1)
Immature Granulocytes: 0 %
Lymphocytes Absolute: 1.6 10*3/uL (ref 0.7–3.1)
Lymphs: 26 %
MCH: 30.3 pg (ref 26.6–33.0)
MCHC: 32.8 g/dL (ref 31.5–35.7)
MCV: 92 fL (ref 79–97)
Monocytes Absolute: 0.5 10*3/uL (ref 0.1–0.9)
Monocytes: 9 %
Neutrophils Absolute: 3.7 10*3/uL (ref 1.4–7.0)
Neutrophils: 59 %
Platelets: 178 10*3/uL (ref 150–450)
RBC: 4.26 x10E6/uL (ref 4.14–5.80)
RDW: 12.8 % (ref 11.6–15.4)
WBC: 6.2 10*3/uL (ref 3.4–10.8)

## 2021-06-21 LAB — FOLATE: Folate: 16.9 ng/mL (ref >3.0)

## 2021-06-21 LAB — VITAMIN B12: Vitamin B-12: 476 pg/mL (ref 232–1245)

## 2021-06-21 LAB — TSH: TSH: 1.86 u[IU]/mL (ref 0.450–4.500)

## 2021-06-21 NOTE — Progress Notes (Signed)
   Subjective: 60 y.o. male presents today status post permanent nail avulsion procedure of the medial border left great toe that was performed on 06/07/2021.  Patient states that he is feeling much better.  He also had an injection to the fifth MTP joint with debridement of the callus and he feels much better.  He has been wearing the arch supports that were also provided.  He presents for further treatment and evaluation  Past Medical History:  Diagnosis Date   Allergy    Depression    Hyperlipidemia    Hypertension    Osteoarthritis    Sleep apnea     Objective: Skin is warm, dry and supple. Nail and respective nail fold appears to be healing appropriately. Open wound to the associated nail fold with a granular wound base and moderate amount of fibrotic tissue. Minimal drainage noted. Mild erythema around the periungual region likely due to phenol chemical matricectomy.  Assessment: #1 s/p partial permanent nail matrixectomy left great toe medial border #2 Fifth MTP capsulitis; resolved #3 symptomatic benign skin lesion plantar aspect of the fifth MTP joint   Plan of care: #1 patient was evaluated  #2 light debridement of open wound was performed to the periungual border of the respective toe using a currette. Antibiotic ointment and Band-Aid was applied. #3 patient is to return to clinic on a PRN basis.   Felecia Shelling, DPM [facility]  Dr. Felecia Shelling, DPM    [address] [place], [place] [address]                Office [phone]  Fax [phone]

## 2021-06-24 ENCOUNTER — Encounter: Payer: Self-pay | Admitting: *Deleted

## 2021-08-07 IMAGING — MR MR SHOULDER*R* W/O CM
5 series · 33 of 40 positions shown · non-contrast
Comparison: None.

CLINICAL DATA: Right shoulder pain for 2-3 months. No known injury.

EXAM:
MRI OF THE RIGHT SHOULDER WITHOUT CONTRAST
TECHNIQUE: Multiplanar, multisequence MR imaging of the shoulder was performed.
No intravenous contrast was administered.

[Series 5: PD fat-sat · axial · right · 4.0mm · 0.55mm/px · z∈[-40,+83]mm · 8 of 28 slices shown (1 of 2)]
[im 1/28]
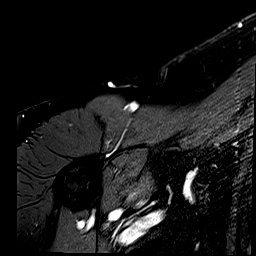
[im 4/28]
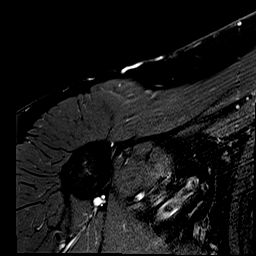
[im 10/28]
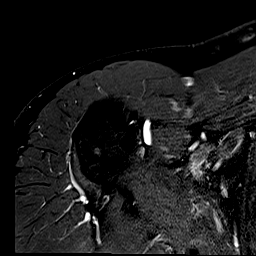
[im 13/28]
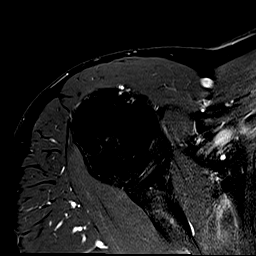
[im 16/28]
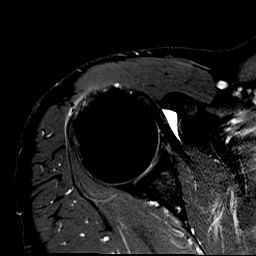
[im 19/28]
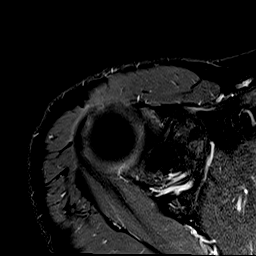
[im 25/28]
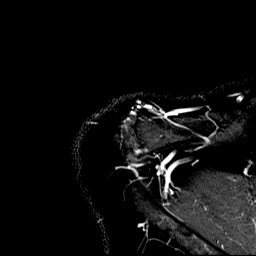
[im 28/28]
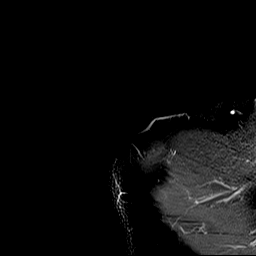

[Series 6: PD fat-sat · oblique · right · 4.0mm · 0.44mm/px · 8 of 26 slices shown (2 of 2)]
[im 1/26]
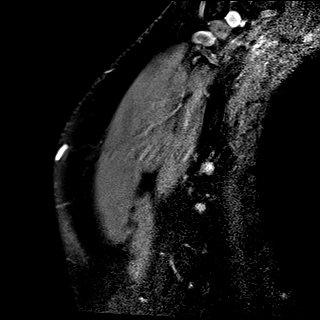
[im 4/26]
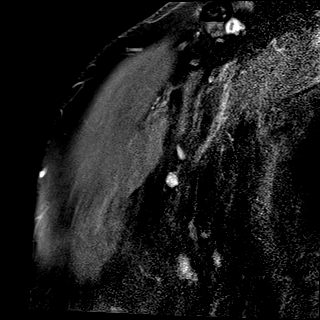
[im 8/26]
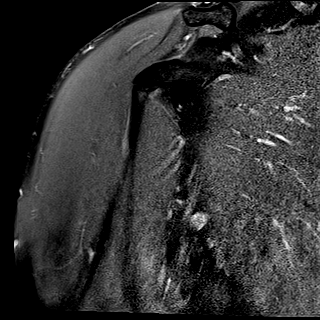
[im 11/26]
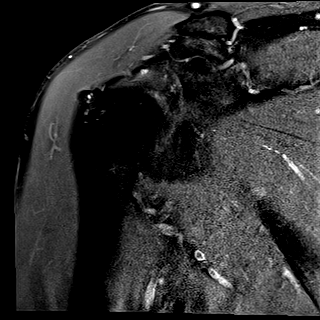
[im 15/26]
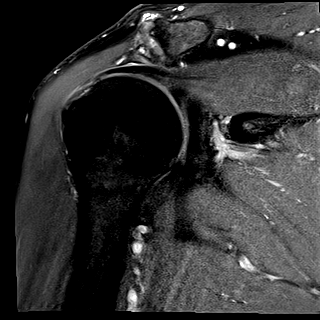
[im 18/26]
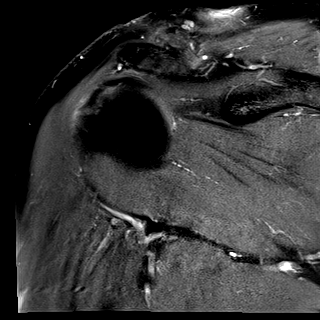
[im 22/26]
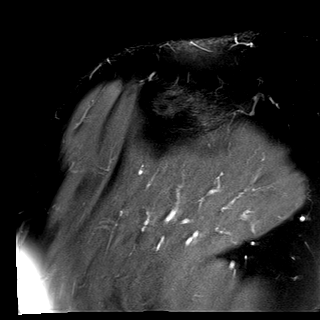
[im 26/26]
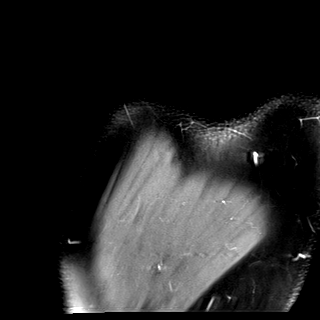

[Series 7: T2 fat-sat · oblique · right · 4.0mm · 0.44mm/px · 8 of 26 slices shown (1 of 2)]
[im 1/26]
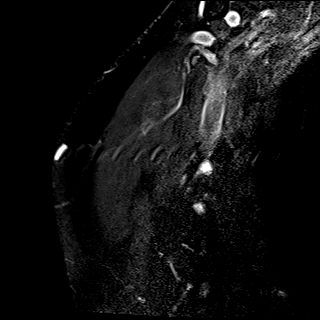
[im 4/26]
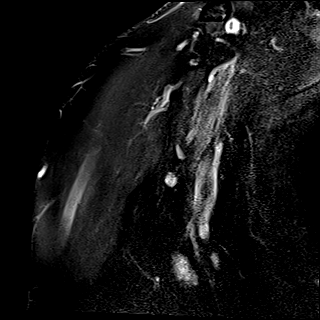
[im 8/26]
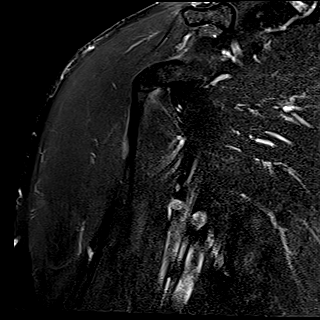
[im 11/26]
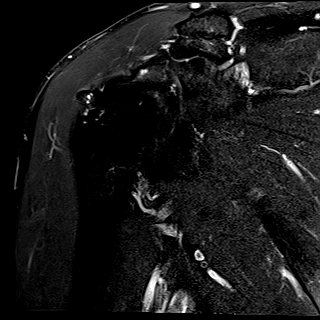
[im 15/26]
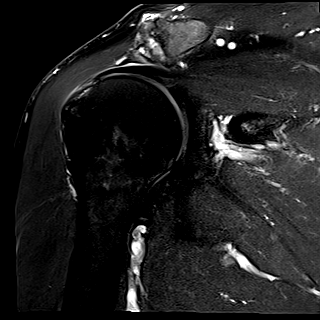
[im 18/26]
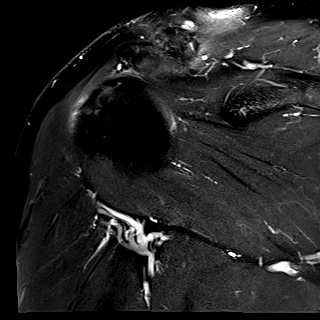
[im 22/26]
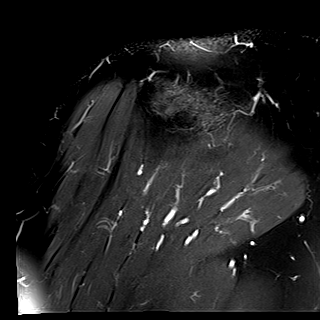
[im 26/26]
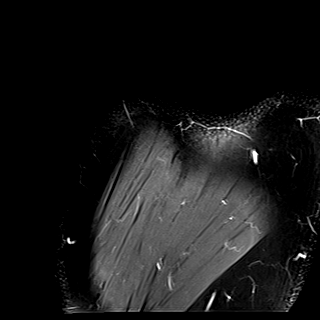

[Series 8: T2 fat-sat · oblique · right · 4.0mm · 0.23mm/px · 7 of 22 slices shown (2 of 2)]
[im 1/22]
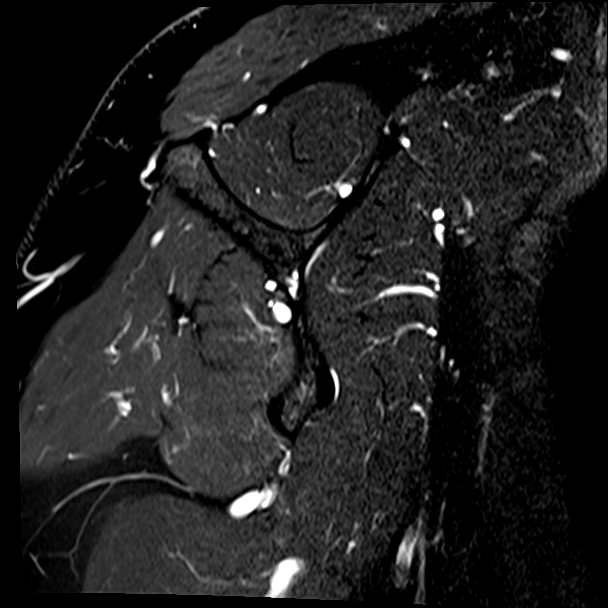
[im 4/22]
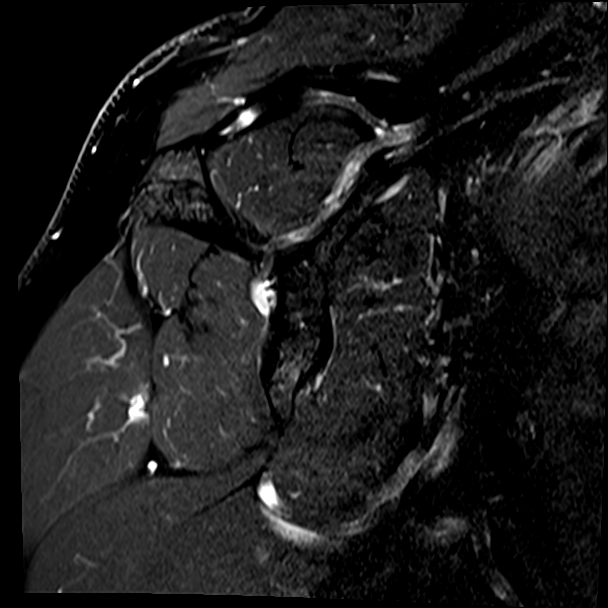
[im 8/22]
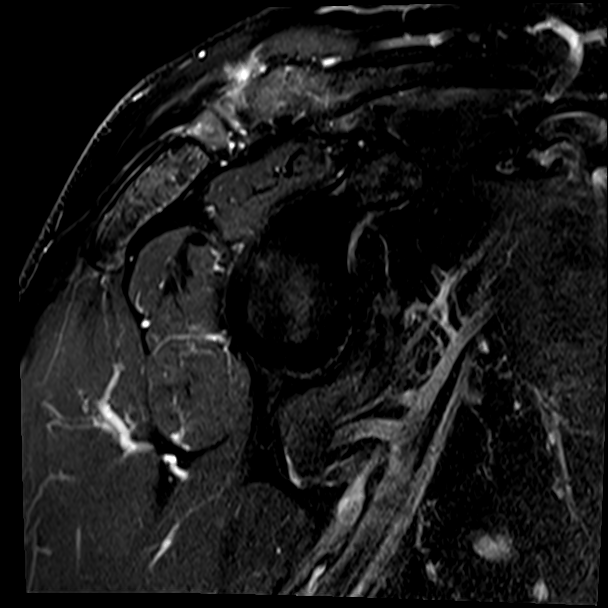
[im 11/22]
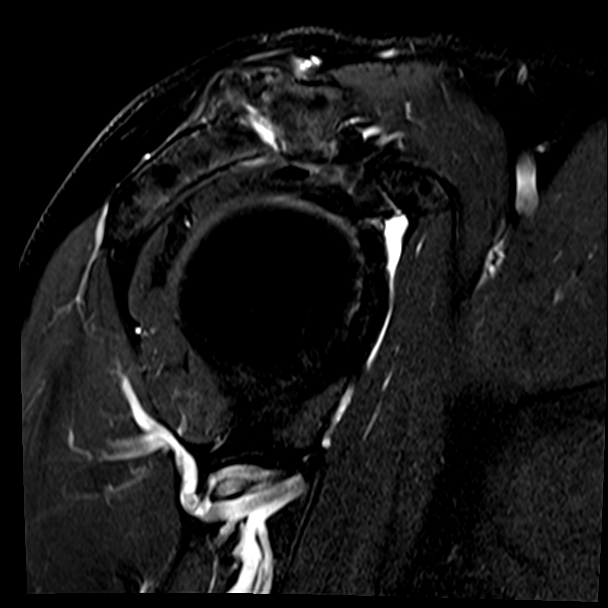
[im 15/22]
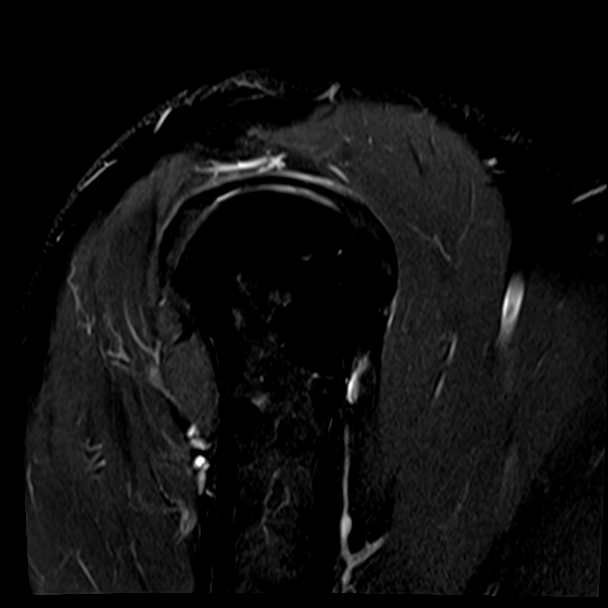
[im 18/22]
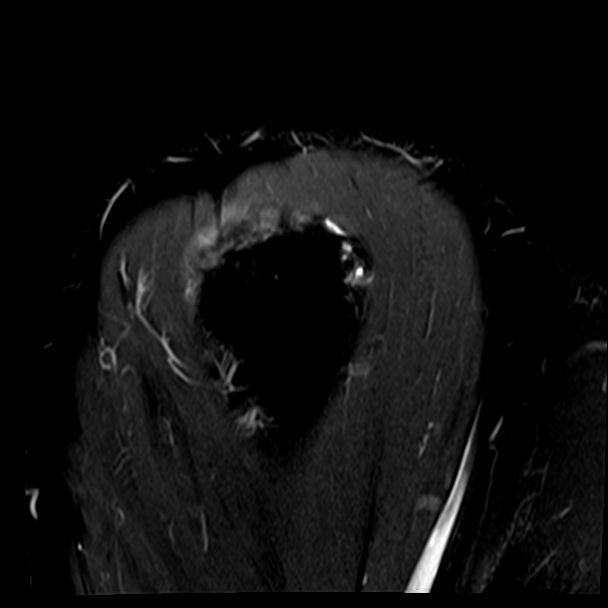
[im 22/22]
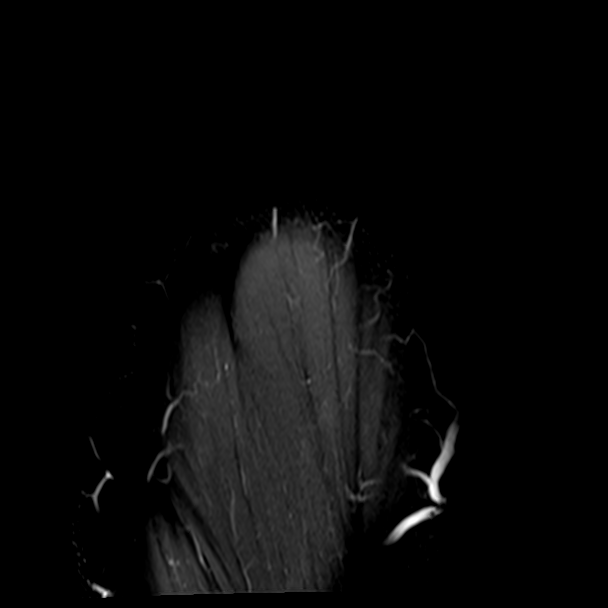

[Series 9: T1 · oblique · right · 4.0mm · 0.36mm/px · 2 of 22 slices shown]
[im 1/22]
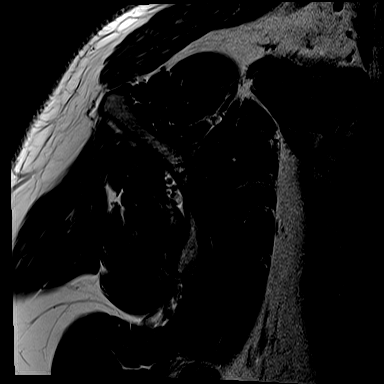
[im 4/22]
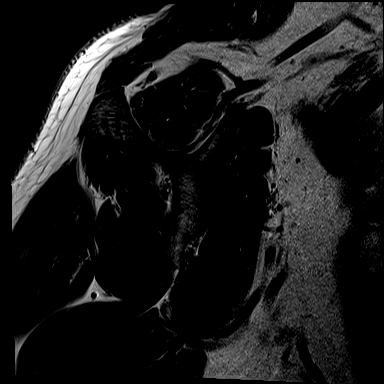

[33 of 40 positions shown; findings below may reference images not displayed]

FINDINGS: Rotator cuff: Rotator cuff tendinopathy appears worst in the
supraspinatus. A tear of the anterior and far lateral supraspinatus
measures 0.4 cm from front to back. There is little to no
retraction.

Muscles:  Normal without atrophy or focal lesion.

Biceps long head:  Intact.

Acromioclavicular Joint: Advanced osteoarthritis is present. Type 2
acromion. No subacromial/subdeltoid bursal fluid.

Glenohumeral Joint: Unremarkable.

Labrum:  Intact.

Bones:  No fracture, contusion or worrisome lesion.

Other: None.
IMPRESSION: Rotator cuff tendinopathy with a 0.4 cm from front to back tear of
the anterior and far lateral supraspinatus. No retraction or
atrophy.

Bulky acromioclavicular osteoarthritis.

## 2021-08-15 ENCOUNTER — Ambulatory Visit: Payer: 59 | Admitting: Internal Medicine

## 2021-10-05 ENCOUNTER — Other Ambulatory Visit: Payer: Self-pay | Admitting: Nurse Practitioner

## 2021-10-07 NOTE — Telephone Encounter (Signed)
Requested Prescriptions  Pending Prescriptions Disp Refills   FLUoxetine (PROZAC) 40 MG capsule [Pharmacy Med Name: FLUOXETINE HCL 40 MG CAPSULE] 90 capsule 0    Sig: Take 1 capsule (40 mg total) by mouth daily. OFFICE VISIT NEEDED FOR ADDITIONAL REFILLS     Psychiatry:  Antidepressants - SSRI Passed - 10/05/2021 11:09 AM      Passed - Completed PHQ-2 or PHQ-9 in the last 360 days      Passed - Valid encounter within last 6 months    Recent Outpatient Visits          3 months ago Screening for colon cancer   [place] Vigg, Avanti, MD   3 months ago Hyperlipidemia with target LDL less than [place] Vigg, Avanti, MD   1 year ago Depression, recurrent ([place])   [place], [place] T, NP   1 year ago Depression, recurrent ([place])   [place], [place] T, NP   1 year ago Right lower quadrant abdominal pain   [place], [place], [place]              rosuvastatin (CRESTOR) 10 MG tablet [Pharmacy Med Name: ROSUVASTATIN CALCIUM 10 MG TAB] 36 tablet 2    Sig: Take one tablet (10 MG) by mouth three days a week.     Cardiovascular:  Antilipid - Statins 2 Failed - 10/05/2021 11:09 AM      Failed - Lipid Panel in normal range within the last 12 months    Cholesterol, Total  Date Value Ref Range Status  06/20/2021 166 100 - 199 mg/dL Final   LDL Chol Calc (NIH)  Date Value Ref Range Status  06/20/2021 109 (H) 0 - 99 mg/dL Final   HDL  Date Value Ref Range Status  06/20/2021 38 (L) >39 mg/dL Final   Triglycerides  Date Value Ref Range Status  06/20/2021 105 0 - 149 mg/dL Final         Passed - Cr in normal range and within 360 days    Creatinine  Date Value Ref Range Status  11/08/2011 1.08 0.60 - 1.30 mg/dL Final   Creatinine, Ser  Date Value Ref Range Status  06/20/2021 0.88 0.76 - 1.27 mg/dL Final         Passed - Patient is not pregnant      Passed - Valid  encounter within last 12 months    Recent Outpatient Visits          3 months ago Screening for colon cancer   [place] Vigg, Avanti, MD   3 months ago Hyperlipidemia with target LDL less than [place] Vigg, Avanti, MD   1 year ago Depression, recurrent ([place])   [place], [place] T, NP   1 year ago Depression, recurrent ([place])   [place], Henrine Screws T, NP   1 year ago Right lower quadrant abdominal pain   [place], [place], [place]

## 2021-10-15 ENCOUNTER — Encounter: Payer: Self-pay | Admitting: Neurology

## 2021-10-15 ENCOUNTER — Ambulatory Visit: Payer: Self-pay | Admitting: Neurology

## 2021-12-16 ENCOUNTER — Other Ambulatory Visit: Payer: Self-pay | Admitting: Internal Medicine

## 2021-12-16 DIAGNOSIS — I10 Essential (primary) hypertension: Secondary | ICD-10-CM

## 2021-12-16 MED ORDER — FLUOXETINE HCL 40 MG PO CAPS: 40.0000 mg | ORAL_CAPSULE | Freq: Every day | ORAL | 0 refills | Status: DC

## 2021-12-16 NOTE — Telephone Encounter (Signed)
Pt states he will run out of FLUoxetine today and the other medications he has until the end of the week. ?

## 2021-12-16 NOTE — Telephone Encounter (Signed)
Please check to see if he has enough to make it to see Dr. Charlotta Newton on Thursday? ?

## 2021-12-16 NOTE — Telephone Encounter (Signed)
Medication Refill - Medication: FLUoxetine (PROZAC) 40 MG capsule  ?rosuvastatin (CRESTOR) 10 MG tablet ?benazepril (LOTENSIN) 20 MG tablet ? ?Has the patient contacted their pharmacy? Yes.   ?(Agent: If no, request that the patient contact the pharmacy for the refill. If patient does not wish to contact the pharmacy document the reason why and proceed with request.) ?(Agent: If yes, when and what did the pharmacy advise?) ? ?Preferred Pharmacy (with phone number or street name):  ?[facility] [place], [place] - [address]  ?[address] [place] [address]  ?Phone: [phone] Fax: [phone]  ? ?Has the patient been seen for an appointment in the last year OR does the patient have an upcoming appointment? Yes.   ? ?Agent: Please be advised that RX refills may take up to 3 business days. We ask that you follow-up with your pharmacy. ? ?

## 2021-12-16 NOTE — Telephone Encounter (Signed)
RX's started for 1 month supplies. Routing to providers in office as patient's PCP is out and patient needs medications.  ?

## 2021-12-17 DIAGNOSIS — M2392 Unspecified internal derangement of left knee: Secondary | ICD-10-CM | POA: Diagnosis not present

## 2021-12-17 DIAGNOSIS — M25562 Pain in left knee: Secondary | ICD-10-CM | POA: Diagnosis not present

## 2021-12-19 ENCOUNTER — Encounter: Payer: Self-pay | Admitting: Internal Medicine

## 2021-12-19 ENCOUNTER — Ambulatory Visit: Payer: 59 | Admitting: Internal Medicine

## 2021-12-19 VITALS — BP 148/90 | HR 71 | Temp 98.6°F | Ht 70.08 in | Wt 225.6 lb

## 2021-12-19 DIAGNOSIS — F339 Major depressive disorder, recurrent, unspecified: Secondary | ICD-10-CM | POA: Diagnosis not present

## 2021-12-19 DIAGNOSIS — E782 Mixed hyperlipidemia: Secondary | ICD-10-CM | POA: Diagnosis not present

## 2021-12-19 DIAGNOSIS — I10 Essential (primary) hypertension: Secondary | ICD-10-CM | POA: Diagnosis not present

## 2021-12-19 DIAGNOSIS — R69 Illness, unspecified: Secondary | ICD-10-CM | POA: Diagnosis not present

## 2021-12-19 MED ORDER — HYDROCHLOROTHIAZIDE 12.5 MG PO TABS: 12.5000 mg | ORAL_TABLET | Freq: Every day | ORAL | 3 refills | Status: DC

## 2021-12-19 MED ORDER — FLUOXETINE HCL 40 MG PO CAPS: 40.0000 mg | ORAL_CAPSULE | Freq: Every day | ORAL | 0 refills | Status: DC

## 2021-12-19 MED ORDER — ROSUVASTATIN CALCIUM 10 MG PO TABS: 10.0000 mg | ORAL_TABLET | Freq: Every day | ORAL | 2 refills | Status: DC

## 2021-12-19 NOTE — Progress Notes (Signed)
? ?BP (!) 148/90   Pulse 71   Temp 98.6 ?F (37 ?C) (Oral)   Ht 5' 10.08" (1.78 m)   Wt 225 lb 9.6 oz (102.3 kg)   SpO2 97%   BMI 32.30 kg/m?   ? ?Subjective:  ? ? Patient ID: Peter Bennett, male    DOB: 1960/09/18, 61 y.o.   MRN: [id number] ? ?Chief Complaint  ?Patient presents with  ? Medication Refill  ? Depression  ? Hyperlipidemia  ? ? ?HPI: ?Peter Bennett is a 61 y.o. male ? 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 ?Medication Refill ?This is a chronic problem. The current episode started more than 1 year ago. Pertinent negatives include no abdominal pain, chest pain, chills, congestion, coughing, fatigue, fever, headaches, myalgias, nausea, neck pain, numbness, rash, vomiting or weakness.  ?Depression ?       This is a chronic problem.  The current episode started 1 to 4 weeks ago.   Associated symptoms include no decreased concentration, no fatigue, no appetite change, no myalgias, no headaches and no suicidal ideas. ?Hyperlipidemia ?This is a chronic (is on crestor 10 mg daily. has been eating fair not the best.) problem. The current episode started more than 1 year ago. Pertinent negatives include no chest pain, myalgias or shortness of breath.  ? ?Chief Complaint  ?Patient presents with  ? Medication Refill  ? Depression  ? Hyperlipidemia  ? ? ?Relevant past medical, surgical, family and social history reviewed and updated as indicated. Interim medical history since our last visit reviewed. ?Allergies and medications reviewed and updated. ? ?Review of Systems  ?Constitutional:  Negative for activity change, appetite change, chills, fatigue and fever.  ?HENT:  Negative for congestion, ear discharge, ear pain and facial swelling.   ?Eyes:  Negative for pain, discharge and itching.  ?Respiratory:  Negative for cough, chest tightness, shortness of breath and wheezing.   ?Cardiovascular:  Negative for  chest pain, palpitations and leg swelling.  ?Gastrointestinal:  Negative for abdominal distention, abdominal pain, blood in stool, constipation, diarrhea, nausea and vomiting.  ?Endocrine: Negative for polyuria.  ?Genitourinary:  Negative for difficulty urinating, dysuria, flank pain, frequency, hematuria and urgency.  ?Musculoskeletal:  Negative for myalgias and neck pain.  ?Skin:  Negative for color change, rash and wound.  ?Neurological:  Negative for dizziness, tremors, speech difficulty, weakness, light-headedness, numbness and headaches.  ?Hematological:  Does not bruise/bleed easily.  ?Psychiatric/Behavioral:  Positive for depression. Negative for agitation, confusion, decreased concentration and suicidal ideas.   ? ?Per HPI unless specifically indicated above ? ?   ?Objective:  ?  ?BP (!) 148/90   Pulse 71   Temp 98.6 ?F (37 ?C) (Oral)   Ht 5' 10.08" (1.78 m)   Wt 225 lb 9.6 oz (102.3 kg)   SpO2 97%   BMI 32.30 kg/m?   ?Wt Readings from Last 3 Encounters:  ?12/19/21 225 lb 9.6 oz (102.3 kg)  ?06/20/21 212 lb 6.4 oz (96.3 kg)  ?07/30/20 210 lb (95.3 kg)  ?  ?Physical Exam ?Vitals and nursing note reviewed.  ?Constitutional:   ?   General: He is not in acute distress. ?   Appearance: Normal appearance. He is not ill-appearing or diaphoretic.  ?HENT:  ?   Head: Normocephalic and atraumatic.  ?   Right Ear: Tympanic membrane and external ear normal. There is no impacted cerumen.  ?  Left Ear: External ear normal.  ?   Nose: No congestion or rhinorrhea.  ?   Mouth/Throat:  ?   Pharynx: No oropharyngeal exudate or posterior oropharyngeal erythema.  ?Eyes:  ?   Conjunctiva/sclera: Conjunctivae normal.  ?   Pupils: Pupils are equal, round, and reactive to light.  ?Cardiovascular:  ?   Rate and Rhythm: Normal rate and regular rhythm.  ?   Heart sounds: No murmur heard. ?  No friction rub. No gallop.  ?Pulmonary:  ?   Effort: No respiratory distress.  ?   Breath sounds: No stridor. No wheezing or rhonchi.   ?Chest:  ?   Chest wall: No tenderness.  ?Abdominal:  ?   General: Abdomen is flat. Bowel sounds are normal.  ?   Palpations: Abdomen is soft. There is no mass.  ?   Tenderness: There is no abdominal tenderness.  ?   Hernia: No hernia is present.  ?Musculoskeletal:     ?   General: Swelling present. No tenderness or deformity.  ?   Cervical back: Normal range of motion and neck supple. No rigidity or tenderness.  ?   Right lower leg: No edema.  ?   Left lower leg: No edema.  ?Skin: ?   General: Skin is warm and dry.  ?   Coloration: Skin is not pale.  ?   Findings: No bruising.  ?Neurological:  ?   Mental Status: He is alert.  ?   Cranial Nerves: No cranial nerve deficit.  ?   Sensory: No sensory deficit.  ?Psychiatric:     ?   Mood and Affect: Mood normal.     ?   Behavior: Behavior normal.     ?   Thought Content: Thought content normal.     ?   Judgment: Judgment normal.  ? ? ?Results for orders placed or performed in visit on 06/20/21  ?Folate  ?Result Value Ref Range  ? Folate 16.9 >3.0 ng/mL  ?TSH  ?Result Value Ref Range  ? TSH 1.860 0.450 - 4.500 uIU/mL  ?Vitamin B12  ?Result Value Ref Range  ? Vitamin B-12 476 232 - 1,245 pg/mL  ?Lipid Panel w/o Chol/HDL Ratio  ?Result Value Ref Range  ? Cholesterol, Total 166 100 - 199 mg/dL  ? Triglycerides 105 0 - 149 mg/dL  ? HDL 38 (L) >39 mg/dL  ? VLDL Cholesterol Cal 19 5 - 40 mg/dL  ? LDL Chol Calc (NIH) 109 (H) 0 - 99 mg/dL  ?Comprehensive metabolic panel  ?Result Value Ref Range  ? Glucose 70 70 - 99 mg/dL  ? BUN 19 8 - 27 mg/dL  ? Creatinine, Ser 0.88 0.76 - 1.27 mg/dL  ? eGFR 97 >59 mL/min/1.73  ? BUN/Creatinine Ratio 22 10 - 24  ? Sodium 140 134 - 144 mmol/L  ? Potassium 4.5 3.5 - 5.2 mmol/L  ? Chloride 103 96 - 106 mmol/L  ? CO2 25 20 - 29 mmol/L  ? Calcium 9.1 8.6 - 10.2 mg/dL  ? Total Protein 6.4 6.0 - 8.5 g/dL  ? Albumin 4.4 3.8 - 4.8 g/dL  ? Globulin, Total 2.0 1.5 - 4.5 g/dL  ? Albumin/Globulin Ratio 2.2 1.2 - 2.2  ? Bilirubin Total 0.5 0.0 - 1.2 mg/dL   ? Alkaline Phosphatase 68 44 - 121 IU/L  ? AST 22 0 - 40 IU/L  ? ALT 23 0 - 44 IU/L  ?CBC with Differential/Platelet  ?Result Value Ref Range  ? WBC 6.2 3.4 -  10.8 x10E3/uL  ? RBC 4.26 4.14 - 5.80 x10E6/uL  ? Hemoglobin 12.9 (L) 13.0 - 17.7 g/dL  ? Hematocrit 39.3 37.5 - 51.0 %  ? MCV 92 79 - 97 fL  ? MCH 30.3 26.6 - 33.0 pg  ? MCHC 32.8 31.5 - 35.7 g/dL  ? RDW 12.8 11.6 - 15.4 %  ? Platelets 178 150 - 450 x10E3/uL  ? Neutrophils 59 Not Estab. %  ? Lymphs 26 Not Estab. %  ? Monocytes 9 Not Estab. %  ? Eos 5 Not Estab. %  ? Basos 1 Not Estab. %  ? Neutrophils Absolute 3.7 1.4 - 7.0 x10E3/uL  ? Lymphocytes Absolute 1.6 0.7 - 3.1 x10E3/uL  ? Monocytes Absolute 0.5 0.1 - 0.9 x10E3/uL  ? EOS (ABSOLUTE) 0.3 0.0 - 0.4 x10E3/uL  ? Basophils Absolute 0.1 0.0 - 0.2 x10E3/uL  ? Immature Granulocytes 0 Not Estab. %  ? Immature Grans (Abs) 0.0 0.0 - 0.1 x10E3/uL  ? ?   ? ? ?Current Outpatient Medications:  ?  aspirin 81 MG tablet, Take 81 mg by mouth daily., Disp: , Rfl:  ?  benazepril (LOTENSIN) 20 MG tablet, Take 1 tablet (20 mg total) by mouth daily., Disp: 90 tablet, Rfl: 4 ?  Cholecalciferol 25 MCG (1000 UT) capsule, Take 1,000 Units by mouth daily. , Disp: , Rfl:  ?  fluticasone (FLONASE) 50 MCG/ACT nasal spray, Place 1 spray into both nostrils daily as needed for allergies or rhinitis., Disp: , Rfl:  ?  Omega-3 Fatty Acids (FISH OIL) 1000 MG CAPS, Take 1,000 mg by mouth daily. , Disp: , Rfl:  ?  FLUoxetine (PROZAC) 40 MG capsule, Take 1 capsule (40 mg total) by mouth daily. OFFICE VISIT NEEDED FOR ADDITIONAL REFILLS, Disp: 30 capsule, Rfl: 0 ?  rosuvastatin (CRESTOR) 10 MG tablet, Take 1 tablet (10 mg total) by mouth daily., Disp: 90 tablet, Rfl: 2  ? ? ?Assessment & Plan:  ?Lower ext swelling ?Will start pt on hctz ?? Needs ECHO ? ? ?HTN :  ?Continue benazapril and add hctz ?Medication compliance emphasised. pt advised to keep Bp logs. Pt verbalised understanding of the same. Pt to have a low salt diet . Exercise to  reach a goal of at least 150 mins a week.  lifestyle modifications explained and pt understands importance of the above. ?Under good control on current regimen. Continue current regimen. Continue to monitor. Call with any

## 2022-01-15 ENCOUNTER — Other Ambulatory Visit: Payer: 59

## 2022-01-20 ENCOUNTER — Other Ambulatory Visit: Payer: 59

## 2022-01-20 DIAGNOSIS — F339 Major depressive disorder, recurrent, unspecified: Secondary | ICD-10-CM

## 2022-01-20 DIAGNOSIS — I10 Essential (primary) hypertension: Secondary | ICD-10-CM | POA: Diagnosis not present

## 2022-01-20 DIAGNOSIS — R69 Illness, unspecified: Secondary | ICD-10-CM | POA: Diagnosis not present

## 2022-01-20 DIAGNOSIS — E782 Mixed hyperlipidemia: Secondary | ICD-10-CM | POA: Diagnosis not present

## 2022-01-20 LAB — URINALYSIS, ROUTINE W REFLEX MICROSCOPIC
Bilirubin, UA: NEGATIVE
Glucose, UA: NEGATIVE
Ketones, UA: NEGATIVE
Leukocytes,UA: NEGATIVE
Nitrite, UA: NEGATIVE
Protein,UA: NEGATIVE
RBC, UA: NEGATIVE
Specific Gravity, UA: >1.03 — ABNORMAL HIGH (ref 1.005–1.030)
Urobilinogen, Ur: 0.2 mg/dL (ref 0.2–1.0)
pH, UA: 5.5 (ref 5.0–7.5)

## 2022-01-21 ENCOUNTER — Ambulatory Visit (INDEPENDENT_AMBULATORY_CARE_PROVIDER_SITE_OTHER): Payer: 59 | Admitting: Internal Medicine

## 2022-01-21 ENCOUNTER — Encounter: Payer: Self-pay | Admitting: Internal Medicine

## 2022-01-21 VITALS — BP 122/81 | HR 84 | Temp 97.7°F | Ht 70.08 in | Wt 225.8 lb

## 2022-01-21 DIAGNOSIS — E785 Hyperlipidemia, unspecified: Secondary | ICD-10-CM | POA: Diagnosis not present

## 2022-01-21 DIAGNOSIS — I10 Essential (primary) hypertension: Secondary | ICD-10-CM

## 2022-01-21 LAB — CBC WITH DIFFERENTIAL/PLATELET
Basophils Absolute: 0.1 10*3/uL (ref 0.0–0.2)
Basos: 1 %
EOS (ABSOLUTE): 0.3 10*3/uL (ref 0.0–0.4)
Eos: 4 %
Hematocrit: 39.4 % (ref 37.5–51.0)
Hemoglobin: 13.2 g/dL (ref 13.0–17.7)
Immature Grans (Abs): 0.1 10*3/uL (ref 0.0–0.1)
Immature Granulocytes: 1 %
Lymphocytes Absolute: 1.8 10*3/uL (ref 0.7–3.1)
Lymphs: 29 %
MCH: 30.5 pg (ref 26.6–33.0)
MCHC: 33.5 g/dL (ref 31.5–35.7)
MCV: 91 fL (ref 79–97)
Monocytes Absolute: 0.5 10*3/uL (ref 0.1–0.9)
Monocytes: 8 %
Neutrophils Absolute: 3.5 10*3/uL (ref 1.4–7.0)
Neutrophils: 57 %
Platelets: 217 10*3/uL (ref 150–450)
RBC: 4.33 x10E6/uL (ref 4.14–5.80)
RDW: 12.9 % (ref 11.6–15.4)
WBC: 6.1 10*3/uL (ref 3.4–10.8)

## 2022-01-21 LAB — COMPREHENSIVE METABOLIC PANEL
ALT: 20 IU/L (ref 0–44)
AST: 18 IU/L (ref 0–40)
Albumin/Globulin Ratio: 1.6 (ref 1.2–2.2)
Albumin: 3.9 g/dL (ref 3.8–4.8)
Alkaline Phosphatase: 71 IU/L (ref 44–121)
BUN/Creatinine Ratio: 14 (ref 10–24)
BUN: 14 mg/dL (ref 8–27)
Bilirubin Total: <0.2 mg/dL (ref 0.0–1.2)
CO2: 24 mmol/L (ref 20–29)
Calcium: 9.1 mg/dL (ref 8.6–10.2)
Chloride: 103 mmol/L (ref 96–106)
Creatinine, Ser: 0.98 mg/dL (ref 0.76–1.27)
Globulin, Total: 2.5 g/dL (ref 1.5–4.5)
Glucose: 102 mg/dL — ABNORMAL HIGH (ref 70–99)
Potassium: 4.3 mmol/L (ref 3.5–5.2)
Sodium: 139 mmol/L (ref 134–144)
Total Protein: 6.4 g/dL (ref 6.0–8.5)
eGFR: 87 mL/min/{1.73_m2} (ref >59)

## 2022-01-21 LAB — LIPID PANEL
Chol/HDL Ratio: 4.1 ratio (ref 0.0–5.0)
Cholesterol, Total: 187 mg/dL (ref 100–199)
HDL: 46 mg/dL (ref >39)
LDL Chol Calc (NIH): 118 mg/dL — ABNORMAL HIGH (ref 0–99)
Triglycerides: 129 mg/dL (ref 0–149)
VLDL Cholesterol Cal: 23 mg/dL (ref 5–40)

## 2022-01-21 LAB — TSH: TSH: 2.69 u[IU]/mL (ref 0.450–4.500)

## 2022-01-21 NOTE — Progress Notes (Signed)
BP 122/81   Pulse 84   Temp 97.7 F (36.5 C) (Oral)   Ht 5' 10.08" (1.78 m)   Wt 225 lb 12.8 oz (102.4 kg)   SpO2 98%   BMI 32.33 kg/m    Subjective:    Patient ID: Peter Bennett, male    DOB: [birth year], 61 y.o.   MRN: [id number]  Chief Complaint  Patient presents with   Hypertension   Hyperlipidemia   lab work    Review lab work     HPI: Peter Bennett is a 60 y.o. male  Hypertension This is a chronic (is on benazarpeil hctz for such) problem. The current episode started more than 1 year ago. The problem has been gradually improving since onset. The problem is controlled. Pertinent negatives include no anxiety, blurred vision, chest pain, headaches, malaise/fatigue, neck pain, orthopnea, palpitations, shortness of breath or sweats.  Hyperlipidemia This is a chronic problem. The current episode started more than 1 year ago. He has no history of obesity or nephrotic syndrome. Pertinent negatives include no chest pain, myalgias or shortness of breath.   Chief Complaint  Patient presents with   Hypertension   Hyperlipidemia   lab work    Review lab work     Relevant past medical, surgical, family and social history reviewed and updated as indicated. Interim medical history since our last visit reviewed. Allergies and medications reviewed and updated.  Review of Systems  Constitutional:  Negative for activity change, appetite change, chills, fatigue, fever and malaise/fatigue.  HENT:  Negative for congestion, ear discharge, ear pain and facial swelling.   Eyes:  Negative for blurred vision, pain, discharge and itching.  Respiratory:  Negative for cough, chest tightness, shortness of breath and wheezing.   Cardiovascular:  Negative for chest pain, palpitations, orthopnea and leg swelling.  Gastrointestinal:  Negative for abdominal distention, abdominal pain, blood in stool, constipation, diarrhea, nausea and vomiting.  Endocrine: Negative for cold intolerance,  heat intolerance, polydipsia, polyphagia and polyuria.  Genitourinary:  Negative for difficulty urinating, dysuria, flank pain, frequency, hematuria and urgency.  Musculoskeletal:  Negative for arthralgias, gait problem, joint swelling, myalgias and neck pain.  Skin:  Negative for color change, rash and wound.  Neurological:  Negative for dizziness, tremors, speech difficulty, weakness, light-headedness, numbness and headaches.  Hematological:  Does not bruise/bleed easily.  Psychiatric/Behavioral:  Negative for agitation, confusion, decreased concentration, sleep disturbance and suicidal ideas.    Per HPI unless specifically indicated above     Objective:    BP 122/81   Pulse 84   Temp 97.7 F (36.5 C) (Oral)   Ht 5' 10.08" (1.78 m)   Wt 225 lb 12.8 oz (102.4 kg)   SpO2 98%   BMI 32.33 kg/m   Wt Readings from Last 3 Encounters:  01/21/22 225 lb 12.8 oz (102.4 kg)  12/19/21 225 lb 9.6 oz (102.3 kg)  06/20/21 212 lb 6.4 oz (96.3 kg)    Physical Exam Vitals and nursing note reviewed.  Constitutional:      General: He is not in acute distress.    Appearance: Normal appearance. He is not ill-appearing or diaphoretic.  HENT:     Head: Atraumatic.  Cardiovascular:     Rate and Rhythm: Normal rate and regular rhythm.     Heart sounds: No murmur heard.   No friction rub. No gallop.  Pulmonary:     Effort: No respiratory distress.     Breath sounds: No stridor. No wheezing, rhonchi  or rales.  Chest:     Chest wall: No tenderness.  Abdominal:     General: There is no distension.     Palpations: There is no mass.  Musculoskeletal:        General: Tenderness present. No swelling, deformity or signs of injury.     Right lower leg: No edema.     Left lower leg: No edema.  Skin:    General: Skin is warm and dry.  Neurological:     Mental Status: He is alert.     Cranial Nerves: No cranial nerve deficit.     Sensory: No sensory deficit.  Psychiatric:        Mood and Affect:  Mood normal.        Behavior: Behavior normal.        Thought Content: Thought content normal.    Results for orders placed or performed in visit on 01/20/22  TSH  Result Value Ref Range   TSH 2.690 0.450 - 4.500 uIU/mL  Urinalysis, Routine w reflex microscopic  Result Value Ref Range   Specific Gravity, UA >1.030 (H) 1.005 - 1.030   pH, UA 5.5 5.0 - 7.5   Color, UA Yellow Yellow   Appearance Ur Clear Clear   Leukocytes,UA Negative Negative   Protein,UA Negative Negative/Trace   Glucose, UA Negative Negative   Ketones, UA Negative Negative   RBC, UA Negative Negative   Bilirubin, UA Negative Negative   Urobilinogen, Ur 0.2 0.2 - 1.0 mg/dL   Nitrite, UA Negative Negative  Lipid panel  Result Value Ref Range   Cholesterol, Total 187 100 - 199 mg/dL   Triglycerides 129 0 - 149 mg/dL   HDL 46 >39 mg/dL   VLDL Cholesterol Cal 23 5 - 40 mg/dL   LDL Chol Calc (NIH) 118 (H) 0 - 99 mg/dL   Chol/HDL Ratio 4.1 0.0 - 5.0 ratio  Comprehensive metabolic panel  Result Value Ref Range   Glucose 102 (H) 70 - 99 mg/dL   BUN 14 8 - 27 mg/dL   Creatinine, Ser 0.98 0.76 - 1.27 mg/dL   eGFR 87 >59 mL/min/1.73   BUN/Creatinine Ratio 14 10 - 24   Sodium 139 134 - 144 mmol/L   Potassium 4.3 3.5 - 5.2 mmol/L   Chloride 103 96 - 106 mmol/L   CO2 24 20 - 29 mmol/L   Calcium 9.1 8.6 - 10.2 mg/dL   Total Protein 6.4 6.0 - 8.5 g/dL   Albumin 3.9 3.8 - 4.8 g/dL   Globulin, Total 2.5 1.5 - 4.5 g/dL   Albumin/Globulin Ratio 1.6 1.2 - 2.2   Bilirubin Total <0.2 0.0 - 1.2 mg/dL   Alkaline Phosphatase 71 44 - 121 IU/L   AST 18 0 - 40 IU/L   ALT 20 0 - 44 IU/L  CBC with Differential/Platelet  Result Value Ref Range   WBC 6.1 3.4 - 10.8 x10E3/uL   RBC 4.33 4.14 - 5.80 x10E6/uL   Hemoglobin 13.2 13.0 - 17.7 g/dL   Hematocrit 39.4 37.5 - 51.0 %   MCV 91 79 - 97 fL   MCH 30.5 26.6 - 33.0 pg   MCHC 33.5 31.5 - 35.7 g/dL   RDW 12.9 11.6 - 15.4 %   Platelets 217 150 - 450 x10E3/uL   Neutrophils 57  Not Estab. %   Lymphs 29 Not Estab. %   Monocytes 8 Not Estab. %   Eos 4 Not Estab. %   Basos 1 Not Estab. %  Neutrophils Absolute 3.5 1.4 - 7.0 x10E3/uL   Lymphocytes Absolute 1.8 0.7 - 3.1 x10E3/uL   Monocytes Absolute 0.5 0.1 - 0.9 x10E3/uL   EOS (ABSOLUTE) 0.3 0.0 - 0.4 x10E3/uL   Basophils Absolute 0.1 0.0 - 0.2 x10E3/uL   Immature Granulocytes 1 Not Estab. %   Immature Grans (Abs) 0.1 0.0 - 0.1 x10E3/uL        Current Outpatient Medications:    aspirin 81 MG tablet, Take 81 mg by mouth daily., Disp: , Rfl:    benazepril (LOTENSIN) 20 MG tablet, Take 1 tablet (20 mg total) by mouth daily., Disp: 90 tablet, Rfl: 4   Cholecalciferol 25 MCG (1000 UT) capsule, Take 1,000 Units by mouth daily. , Disp: , Rfl:    FLUoxetine (PROZAC) 40 MG capsule, Take 1 capsule (40 mg total) by mouth daily. OFFICE VISIT NEEDED FOR ADDITIONAL REFILLS, Disp: 30 capsule, Rfl: 0   fluticasone (FLONASE) 50 MCG/ACT nasal spray, Place 1 spray into both nostrils daily as needed for allergies or rhinitis., Disp: , Rfl:    hydrochlorothiazide (HYDRODIURIL) 12.5 MG tablet, Take 1 tablet (12.5 mg total) by mouth daily., Disp: 30 tablet, Rfl: 3   Omega-3 Fatty Acids (FISH OIL) 1000 MG CAPS, Take 1,000 mg by mouth daily. , Disp: , Rfl:    rosuvastatin (CRESTOR) 10 MG tablet, Take 1 tablet (10 mg total) by mouth daily., Disp: 90 tablet, Rfl: 2    Assessment & Plan:  HTN : Continue current meds.  Medication compliance emphasised. pt advised to keep Bp logs. Pt verbalised understanding of the same. Pt to have a low salt diet . Exercise to reach a goal of at least 150 mins a week.  lifestyle modifications explained and pt understands importance of the above. Under good control on current regimen. Continue current regimen. Continue to monitor. Call with any concerns. Refills given. Labs drawn today.  2. HLD is on crestor for such  recheck FLP, check LFT's work on diet, SE of meds explained to pt. low fat and high fiber  diet explained to pt.  Problem List Items Addressed This Visit   None    No orders of the defined types were placed in this encounter.    No orders of the defined types were placed in this encounter.    Follow up plan: No follow-ups on file.

## 2022-02-03 ENCOUNTER — Other Ambulatory Visit: Payer: Self-pay | Admitting: Nurse Practitioner

## 2022-02-03 DIAGNOSIS — I10 Essential (primary) hypertension: Secondary | ICD-10-CM

## 2022-02-03 NOTE — Telephone Encounter (Signed)
Requested Prescriptions  Pending Prescriptions Disp Refills  . benazepril (LOTENSIN) 20 MG tablet [Pharmacy Med Name: BENAZEPRIL HCL 20 MG TABLET] 90 tablet 1    Sig: Take 1 tablet (20 mg total) by mouth daily.     Cardiovascular:  ACE Inhibitors Passed - 02/03/2022  9:25 AM      Passed - Cr in normal range and within 180 days    Creatinine  Date Value Ref Range Status  11/08/2011 1.08 0.60 - 1.30 mg/dL Final   Creatinine, Ser  Date Value Ref Range Status  01/20/2022 0.98 0.76 - 1.27 mg/dL Final         Passed - K in normal range and within 180 days    Potassium  Date Value Ref Range Status  01/20/2022 4.3 3.5 - 5.2 mmol/L Final  11/08/2011 5.2 (H) 3.5 - 5.1 mmol/L Final         Passed - Patient is not pregnant      Passed - Last BP in normal range    BP Readings from Last 1 Encounters:  01/21/22 122/81         Passed - Valid encounter within last 6 months    Recent Outpatient Visits          1 week ago Primary hypertension   [facility] Vigg, Avanti, MD   1 month ago Primary hypertension   [facility] Vigg, Avanti, MD   7 months ago Screening for colon cancer   [facility] Vigg, Avanti, MD   7 months ago Hyperlipidemia with target LDL less than 130   [facility] Vigg, Avanti, MD   1 year ago Depression, recurrent (HCC)   [facility] Marjie Skiff, NP      Future Appointments            In 5 months Gabriel Cirri, NP [facility], [facility]

## 2022-02-10 ENCOUNTER — Other Ambulatory Visit: Payer: Self-pay | Admitting: Internal Medicine

## 2022-04-15 ENCOUNTER — Other Ambulatory Visit: Payer: Self-pay | Admitting: Nurse Practitioner

## 2022-04-15 NOTE — Telephone Encounter (Signed)
Requested medication (s) are due for refill today: yes  Requested medication (s) are on the active medication list: yes  Last refill:  12/19/21 #30/3  Future visit scheduled: yes  Notes to clinic:  rx meets protocol but was prescribed by Dr. Charlotta Newton. Please advise     Requested Prescriptions  Pending Prescriptions Disp Refills   hydrochlorothiazide (HYDRODIURIL) 12.5 MG tablet [Pharmacy Med Name: HYDROCHLOROTHIAZIDE 12.5 MG TB] 30 tablet 0    Sig: Take 1 tablet (12.5 mg total) by mouth daily.     Cardiovascular: Diuretics - Thiazide Passed - 04/15/2022 10:09 AM      Passed - Cr in normal range and within 180 days    Creatinine  Date Value Ref Range Status  11/08/2011 1.08 0.60 - 1.30 mg/dL Final   Creatinine, Ser  Date Value Ref Range Status  01/20/2022 0.98 0.76 - 1.27 mg/dL Final         Passed - K in normal range and within 180 days    Potassium  Date Value Ref Range Status  01/20/2022 4.3 3.5 - 5.2 mmol/L Final  11/08/2011 5.2 (H) 3.5 - 5.1 mmol/L Final         Passed - Na in normal range and within 180 days    Sodium  Date Value Ref Range Status  01/20/2022 139 134 - 144 mmol/L Final  11/08/2011 142 136 - 145 mmol/L Final         Passed - Last BP in normal range    BP Readings from Last 1 Encounters:  01/21/22 122/81         Passed - Valid encounter within last 6 months    Recent Outpatient Visits           2 months ago Primary hypertension   [facility] Vigg, Avanti, MD   3 months ago Primary hypertension   [facility] Vigg, Avanti, MD   9 months ago Screening for colon cancer   [facility] Vigg, Avanti, MD   10 months ago Hyperlipidemia with target LDL less than 130   [facility] Vigg, Avanti, MD   1 year ago Depression, recurrent (HCC)   [facility] Marjie Skiff, NP       Future Appointments             In 3 months Gabriel Cirri, NP [facility], [facility]

## 2022-07-07 ENCOUNTER — Other Ambulatory Visit: Payer: Self-pay | Admitting: Nurse Practitioner

## 2022-07-07 DIAGNOSIS — I10 Essential (primary) hypertension: Secondary | ICD-10-CM

## 2022-07-08 NOTE — Telephone Encounter (Signed)
Requested Prescriptions  Pending Prescriptions Disp Refills   benazepril (LOTENSIN) 20 MG tablet [Pharmacy Med Name: BENAZEPRIL HCL 20 MG TABLET] 90 tablet 0    Sig: Take 1 tablet (20 mg total) by mouth daily.     Cardiovascular:  ACE Inhibitors Passed - 07/07/2022  4:38 PM      Passed - Cr in normal range and within 180 days    Creatinine  Date Value Ref Range Status  11/08/2011 1.08 0.60 - 1.30 mg/dL Final   Creatinine, Ser  Date Value Ref Range Status  01/20/2022 0.98 0.76 - 1.27 mg/dL Final         Passed - K in normal range and within 180 days    Potassium  Date Value Ref Range Status  01/20/2022 4.3 3.5 - 5.2 mmol/L Final  11/08/2011 5.2 (H) 3.5 - 5.1 mmol/L Final         Passed - Patient is not pregnant      Passed - Last BP in normal range    BP Readings from Last 1 Encounters:  01/21/22 122/81         Passed - Valid encounter within last 6 months    Recent Outpatient Visits           5 months ago Primary hypertension   [facility] Vigg, Avanti, MD   6 months ago Primary hypertension   [facility] Vigg, Avanti, MD   1 year ago Screening for colon cancer   [facility] Vigg, Avanti, MD   1 year ago Hyperlipidemia with target LDL less than 130   [facility] Vigg, Avanti, MD   1 year ago Depression, recurrent (HCC)   [facility] [place], Dorie Rank, NP       Future Appointments             In 3 weeks Gabriel Cirri, NP [facility], [facility]

## 2022-07-24 ENCOUNTER — Ambulatory Visit: Payer: 59 | Admitting: Physician Assistant

## 2022-07-29 ENCOUNTER — Ambulatory Visit: Payer: 59 | Admitting: Unknown Physician Specialty

## 2022-07-29 DIAGNOSIS — G8929 Other chronic pain: Secondary | ICD-10-CM | POA: Diagnosis not present

## 2022-07-29 DIAGNOSIS — G629 Polyneuropathy, unspecified: Secondary | ICD-10-CM | POA: Diagnosis not present

## 2022-07-29 DIAGNOSIS — M7062 Trochanteric bursitis, left hip: Secondary | ICD-10-CM | POA: Diagnosis not present

## 2022-07-29 DIAGNOSIS — M1712 Unilateral primary osteoarthritis, left knee: Secondary | ICD-10-CM | POA: Diagnosis not present

## 2022-07-31 DIAGNOSIS — R202 Paresthesia of skin: Secondary | ICD-10-CM | POA: Diagnosis not present

## 2022-07-31 DIAGNOSIS — R200 Anesthesia of skin: Secondary | ICD-10-CM | POA: Diagnosis not present

## 2022-07-31 DIAGNOSIS — G629 Polyneuropathy, unspecified: Secondary | ICD-10-CM | POA: Diagnosis not present

## 2022-07-31 DIAGNOSIS — R208 Other disturbances of skin sensation: Secondary | ICD-10-CM | POA: Diagnosis not present

## 2022-08-07 ENCOUNTER — Encounter: Payer: Self-pay | Admitting: Physician Assistant

## 2022-08-07 ENCOUNTER — Ambulatory Visit: Payer: 59 | Admitting: Physician Assistant

## 2022-08-07 VITALS — BP 113/70 | HR 83 | Temp 98.6°F | Ht 70.08 in | Wt 226.9 lb

## 2022-08-07 DIAGNOSIS — J069 Acute upper respiratory infection, unspecified: Secondary | ICD-10-CM

## 2022-08-07 DIAGNOSIS — I10 Essential (primary) hypertension: Secondary | ICD-10-CM | POA: Diagnosis not present

## 2022-08-07 DIAGNOSIS — F339 Major depressive disorder, recurrent, unspecified: Secondary | ICD-10-CM

## 2022-08-07 DIAGNOSIS — E782 Mixed hyperlipidemia: Secondary | ICD-10-CM

## 2022-08-07 DIAGNOSIS — R69 Illness, unspecified: Secondary | ICD-10-CM | POA: Diagnosis not present

## 2022-08-07 MED ORDER — ROSUVASTATIN CALCIUM 10 MG PO TABS: 10.0000 mg | ORAL_TABLET | Freq: Every day | ORAL | 0 refills | Status: DC

## 2022-08-07 MED ORDER — BENAZEPRIL HCL 20 MG PO TABS: 20.0000 mg | ORAL_TABLET | Freq: Every day | ORAL | 0 refills | Status: DC

## 2022-08-07 MED ORDER — HYDROCHLOROTHIAZIDE 12.5 MG PO TABS: 12.5000 mg | ORAL_TABLET | Freq: Every day | ORAL | 0 refills | Status: DC

## 2022-08-07 MED ORDER — FLUOXETINE HCL 40 MG PO CAPS: 40.0000 mg | ORAL_CAPSULE | Freq: Every day | ORAL | 0 refills | Status: DC

## 2022-08-07 NOTE — Patient Instructions (Addendum)
Please schedule your regular follow up with your PCP in about 4 weeks to discuss your cholesterol, blood pressure and depression management Please make sure you are fasting for this apt so we can do labs  Based on your described symptoms and the duration of symptoms it is likely that you have a viral upper respiratory infection (often called a "cold")  Symptoms can last for 3-10 days with lingering cough and intermittent symptoms lasting weeks after that.  The goal of treatment at this time is to reduce your symptoms and discomfort   You can use over the counter medications such as Dayquil/Nyquil, AlkaSeltzer formulations, etc to provide further relief of symptoms according to the manufacturer's instructions  If preferred you can use Coricidin to manage your symptoms rather than those medications mentioned above.    If your symptoms do not improve or become worse in the next 5-7 days please make an apt at the office so we can see you  Go to the ER if you begin to have more serious symptoms such as shortness of breath, trouble breathing, loss of consciousness, swelling around the eyes, high fever, severe lasting headaches, vision changes or neck pain/stiffness.

## 2022-08-07 NOTE — Assessment & Plan Note (Signed)
Chronic, ongoing, stable and appears well controlled per last Lipid panel results  Currently taking Rosuvastatin 10 mg PO QD and appears to be tolerating well Continue current regimen Follow up in 4-6 weeks with new PCP for labs and monitoring

## 2022-08-07 NOTE — Assessment & Plan Note (Signed)
Chronic, stable and well controlled on Prozac 40 mg PO QD Continue current medication regimen Follow up in 6 months or sooner if concerns arise

## 2022-08-07 NOTE — Assessment & Plan Note (Signed)
Chronic, stable  Appears well managed on benazepril 20 mg PO QD, HCTZ 12.5 mg PO QD  Continue current regimen Recommend follow up every 6 months or sooner if concerns arise Discussed follow up  in 4-6 weeks for lipid panel repeat and to discuss ongoing management with new PCP

## 2022-08-07 NOTE — Progress Notes (Signed)
Acute Office Visit   Patient: Peter Bennett   DOB: 04-Jul-1961   61 y.o. Male  MRN: [id number] Visit Date: 08/07/2022  Today's healthcare provider: Oswaldo Conroy Lyam Provencio, PA-C  Introduced myself to the patient as a Secondary school teacher and provided education on APPs in clinical practice.    Chief Complaint  Patient presents with   Ear Pain    Right ear pain started about 10 days ago   Subjective    HPI HPI     Ear Pain    Additional comments: Right ear pain started about 10 days ago      Last edited by Sherolyn Buba, CMA on 08/07/2022  2:04 PM.       Right ear pain  Onset: sudden  Duration: 7-10 days  Associated symptoms: sore throat and ear pain Reports mild rhinorrhea  Sick contacts: yes - reports  COVID testing: has not tested at home since symptoms started.  Interventions: flonase, Zyrtec     HYPERTENSION / HYPERLIPIDEMIA Satisfied with current treatment? yes Duration of hypertension: years BP monitoring frequency: a few times a month BP range: typically in same range it was during today's apt  BP medication side effects: no Past BP meds: benazepril and HCTZ Duration of hyperlipidemia: chronic Cholesterol medication side effects: no Cholesterol supplements: none Past cholesterol medications: rosuvastatin (crestor) Medication compliance: excellent compliance Aspirin: no Recent stressors: yes Recurrent headaches: no Visual changes: no Palpitations: no Dyspnea: no Chest pain: no Lower extremity edema: no Dizzy/lightheaded: no  DEPRESSION Mood status: controlled Satisfied with current treatment?: yes Symptom severity: mild  Duration of current treatment : chronic Side effects: no Medication compliance: excellent compliance Psychotherapy/counseling: no  has been in the past  Previous psychiatric medications: prozac Depressed mood: no Anxious mood: no Anhedonia: no Significant weight loss or gain: no Insomnia: no  neither  Fatigue: yes Feelings of  worthlessness or guilt: no Impaired concentration/indecisiveness: no Suicidal ideations: no Hopelessness: no Crying spells: no    08/07/2022    2:10 PM 01/21/2022    2:12 PM 12/19/2021    2:14 PM 06/20/2021    9:24 AM 06/10/2021    9:03 AM  Depression screen PHQ 2/9  Decreased Interest 1 1 1 1  0  Down, Depressed, Hopeless 1 1 1 1  0  PHQ - 2 Score 2 2 2 2  0  Altered sleeping 2 2 1 1  0  Tired, decreased energy 2 2 1 1  0  Change in appetite 1 0 0 1 0  Feeling bad or failure about yourself  1 0 1 1 0  Trouble concentrating 1 0 0 1 1  Moving slowly or fidgety/restless 1 0 0 0 0  Suicidal thoughts 0 0 0 0 0  PHQ-9 Score 10 6 5 7 1   Difficult doing work/chores Somewhat difficult Somewhat difficult Somewhat difficult Somewhat difficult Not difficult at all      Medications: Outpatient Medications Prior to Visit  Medication Sig   aspirin 81 MG tablet Take 81 mg by mouth daily.   Cholecalciferol 25 MCG (1000 UT) capsule Take 1,000 Units by mouth daily.    fluticasone (FLONASE) 50 MCG/ACT nasal spray Place 1 spray into both nostrils daily as needed for allergies or rhinitis.   gabapentin (NEURONTIN) 100 MG capsule Take 100 mg twice a day for one week, then increase to 200 mg(2 tablets) twice a day and continue   Omega-3 Fatty Acids (FISH OIL) 1000 MG CAPS Take 1,000 mg  by mouth daily.    [DISCONTINUED] benazepril (LOTENSIN) 20 MG tablet Take 1 tablet (20 mg total) by mouth daily.   [DISCONTINUED] FLUoxetine (PROZAC) 40 MG capsule Take 1 capsule (40 mg total) by mouth daily.   [DISCONTINUED] hydrochlorothiazide (HYDRODIURIL) 12.5 MG tablet Take 1 tablet (12.5 mg total) by mouth daily.   [DISCONTINUED] rosuvastatin (CRESTOR) 10 MG tablet Take 1 tablet (10 mg total) by mouth daily.   No facility-administered medications prior to visit.    Review of Systems  Constitutional:  Negative for chills and fever.  HENT:  Positive for ear pain, rhinorrhea and sore throat. Negative for congestion,  sinus pressure and sinus pain.   Respiratory:  Positive for cough. Negative for shortness of breath and wheezing.   Gastrointestinal:  Negative for diarrhea, nausea and vomiting.  Musculoskeletal:  Negative for myalgias.  Neurological:  Positive for headaches.       Objective    BP 113/70   Pulse 83   Temp 98.6 F (37 C) (Oral)   Ht 5' 10.08" (1.78 m)   Wt 226 lb 14.4 oz (102.9 kg)   SpO2 97%   BMI 32.48 kg/m    Physical Exam Vitals reviewed.  Constitutional:      General: He is awake.     Appearance: Normal appearance. He is well-developed and well-groomed.  HENT:     Head: Normocephalic and atraumatic.     Right Ear: Hearing, tympanic membrane, ear canal and external ear normal.     Left Ear: Hearing, tympanic membrane, ear canal and external ear normal.     Mouth/Throat:     Lips: Pink.     Pharynx: Oropharynx is clear. Uvula midline. No oropharyngeal exudate or posterior oropharyngeal erythema.     Tonsils: No tonsillar exudate or tonsillar abscesses.  Cardiovascular:     Rate and Rhythm: Normal rate and regular rhythm.     Pulses: Normal pulses.     Heart sounds: Normal heart sounds. No murmur heard.    No friction rub. No gallop.  Pulmonary:     Effort: Pulmonary effort is normal.     Breath sounds: Normal breath sounds. No decreased air movement. No decreased breath sounds, wheezing, rhonchi or rales.  Musculoskeletal:     Right lower leg: No edema.     Left lower leg: No edema.  Lymphadenopathy:     Head:     Right side of head: No submental, submandibular or preauricular adenopathy.     Left side of head: No submental, submandibular or preauricular adenopathy.     Cervical:     Right cervical: No superficial or posterior cervical adenopathy.    Left cervical: No superficial or posterior cervical adenopathy.     Upper Body:     Right upper body: No supraclavicular adenopathy.     Left upper body: No supraclavicular adenopathy.  Neurological:     Mental  Status: He is alert.  Psychiatric:        Behavior: Behavior is cooperative.       No results found for any visits on 08/07/22.  Assessment & Plan      Return in about 4 weeks (around 09/04/2022) for HTN, Depression, HLD, labs and to establish with new PCP.       Problem List Items Addressed This Visit       Cardiovascular and Mediastinum   Hypertension    Chronic, stable  Appears well managed on benazepril 20 mg PO QD, HCTZ 12.5 mg PO QD  Continue current regimen Recommend follow up every 6 months or sooner if concerns arise Discussed follow up  in 4-6 weeks for lipid panel repeat and to discuss ongoing management with new PCP       Relevant Medications   benazepril (LOTENSIN) 20 MG tablet   hydrochlorothiazide (HYDRODIURIL) 12.5 MG tablet   rosuvastatin (CRESTOR) 10 MG tablet     Other   Depression, recurrent (HCC) - Primary    Chronic, stable and well controlled on Prozac 40 mg PO QD Continue current medication regimen Follow up in 6 months or sooner if concerns arise       Relevant Medications   FLUoxetine (PROZAC) 40 MG capsule   Hyperlipidemia    Chronic, ongoing, stable and appears well controlled per last Lipid panel results  Currently taking Rosuvastatin 10 mg PO QD and appears to be tolerating well Continue current regimen Follow up in 4-6 weeks with new PCP for labs and monitoring       Relevant Medications   benazepril (LOTENSIN) 20 MG tablet   hydrochlorothiazide (HYDRODIURIL) 12.5 MG tablet   rosuvastatin (CRESTOR) 10 MG tablet   Other Visit Diagnoses     Viral upper respiratory tract infection     Acute, new concern Visit with patient indicates symptoms comprised of  mild dry cough, sore throat and ear pain for about 10 days congruent with acute URI that is likely viral in nature  Patient is outside therapeutic window for antivirals- no flu or COVID testing performed today.  Due to nature and duration of symptoms recommended treatment  regimen is symptomatic relief and follow up if needed Discussed with patient the various viral and bacterial etiologies of current illness and appropriate course of treatment Discussed OTC medication options for multisymptom relief such as Dayquil/Nyquil, Theraflu, AlkaSeltzer, etc. Discussed return precautions if symptoms are not improving or worsen over next 5-7 days.          Return in about 4 weeks (around 09/04/2022) for HTN, Depression, HLD, labs and to establish with new PCP.   I, Jashun Puertas E Candis Kabel, PA-C, have reviewed all documentation for this visit. The documentation on 08/07/22 for the exam, diagnosis, procedures, and orders are all accurate and complete.   Jacquelin Hawking, MHS, PA-C [facility]

## 2022-08-12 ENCOUNTER — Ambulatory Visit: Payer: 59 | Admitting: Nurse Practitioner

## 2022-08-21 ENCOUNTER — Ambulatory Visit: Payer: 59 | Admitting: Nurse Practitioner

## 2022-08-31 NOTE — Patient Instructions (Incomplete)

## 2022-09-04 ENCOUNTER — Ambulatory Visit: Payer: 59 | Admitting: Nurse Practitioner

## 2022-09-04 DIAGNOSIS — G4733 Obstructive sleep apnea (adult) (pediatric): Secondary | ICD-10-CM

## 2022-09-04 DIAGNOSIS — Z1159 Encounter for screening for other viral diseases: Secondary | ICD-10-CM

## 2022-09-04 DIAGNOSIS — E782 Mixed hyperlipidemia: Secondary | ICD-10-CM

## 2022-09-04 DIAGNOSIS — Z114 Encounter for screening for human immunodeficiency virus [HIV]: Secondary | ICD-10-CM

## 2022-09-04 DIAGNOSIS — I10 Essential (primary) hypertension: Secondary | ICD-10-CM

## 2022-09-04 DIAGNOSIS — E6609 Other obesity due to excess calories: Secondary | ICD-10-CM

## 2022-09-04 DIAGNOSIS — F339 Major depressive disorder, recurrent, unspecified: Secondary | ICD-10-CM

## 2022-09-07 DIAGNOSIS — G629 Polyneuropathy, unspecified: Secondary | ICD-10-CM | POA: Insufficient documentation

## 2022-09-07 NOTE — Patient Instructions (Signed)

## 2022-09-09 ENCOUNTER — Encounter: Payer: Self-pay | Admitting: Unknown Physician Specialty

## 2022-09-09 ENCOUNTER — Ambulatory Visit: Payer: 59 | Admitting: Unknown Physician Specialty

## 2022-09-09 VITALS — BP 117/79 | HR 80 | Temp 97.7°F | Ht 70.08 in | Wt 238.0 lb

## 2022-09-09 DIAGNOSIS — Z6830 Body mass index (BMI) 30.0-30.9, adult: Secondary | ICD-10-CM

## 2022-09-09 DIAGNOSIS — N400 Enlarged prostate without lower urinary tract symptoms: Secondary | ICD-10-CM | POA: Diagnosis not present

## 2022-09-09 DIAGNOSIS — G629 Polyneuropathy, unspecified: Secondary | ICD-10-CM

## 2022-09-09 DIAGNOSIS — E6609 Other obesity due to excess calories: Secondary | ICD-10-CM

## 2022-09-09 DIAGNOSIS — I10 Essential (primary) hypertension: Secondary | ICD-10-CM

## 2022-09-09 DIAGNOSIS — Z1159 Encounter for screening for other viral diseases: Secondary | ICD-10-CM | POA: Diagnosis not present

## 2022-09-09 DIAGNOSIS — Z114 Encounter for screening for human immunodeficiency virus [HIV]: Secondary | ICD-10-CM | POA: Diagnosis not present

## 2022-09-09 DIAGNOSIS — F339 Major depressive disorder, recurrent, unspecified: Secondary | ICD-10-CM

## 2022-09-09 DIAGNOSIS — R69 Illness, unspecified: Secondary | ICD-10-CM | POA: Diagnosis not present

## 2022-09-09 DIAGNOSIS — E782 Mixed hyperlipidemia: Secondary | ICD-10-CM | POA: Diagnosis not present

## 2022-09-09 DIAGNOSIS — G4733 Obstructive sleep apnea (adult) (pediatric): Secondary | ICD-10-CM

## 2022-09-09 DIAGNOSIS — E66811 Obesity, class 1: Secondary | ICD-10-CM

## 2022-09-09 NOTE — Assessment & Plan Note (Addendum)
On Fluoxetine 40 mg.  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  Not waking up rested.

## 2022-09-09 NOTE — Assessment & Plan Note (Signed)
On a statin.  Check labs today

## 2022-09-09 NOTE — Assessment & Plan Note (Addendum)
Taking Gabapentin.  Feeling it causes weight gain.  Seeing neurology

## 2022-09-09 NOTE — Assessment & Plan Note (Signed)
Stable, continue present medications.   

## 2022-09-09 NOTE — Assessment & Plan Note (Signed)
Recent weight gain.  Discussed diet.

## 2022-09-09 NOTE — Assessment & Plan Note (Signed)
Not using CPAP.  Hasn't used it in years.  Doesn't feel able to go through the process at this time.

## 2022-09-09 NOTE — Progress Notes (Signed)
BP 117/79   Pulse 80   Temp 97.7 F (36.5 C) (Oral)   Ht 5' 10.08" (1.78 m)   Wt 238 lb (108 kg)   SpO2 98%   BMI 34.07 kg/m    Subjective:    Patient ID: Peter Bennett, male    DOB: [birth year], 62 y.o.   MRN: [id number]  HPI: Peter Bennett is a 62 y.o. male  Chief Complaint  Patient presents with   Hypertension   Depression   Hyperlipidemia   Hypertension Using medications without difficulty Average home BPs checks on occasion.  Typically 120/70  No problems or lightheadedness No chest pain with exertion or shortness of breath No Edema  Hyperlipidemia Using medications without problems: No Muscle aches  Diet compliance:  Loves to eat Exercise: not exercising well.  But now working at [employer] and doesn't have much time  Depression:    09/09/2022    3:14 PM 08/07/2022    2:10 PM 01/21/2022    2:12 PM 12/19/2021    2:14 PM 06/20/2021    9:24 AM  Depression screen PHQ 2/9  Decreased Interest 1 1 1 1 1   Down, Depressed, Hopeless 1 1 1 1 1   PHQ - 2 Score 2 2 2 2 2   Altered sleeping 2 2 2 1 1   Tired, decreased energy 2 2 2 1 1   Change in appetite 1 1 0 0 1  Feeling bad or failure about yourself  1 1 0 1 1  Trouble concentrating 1 1 0 0 1  Moving slowly or fidgety/restless 0 1 0 0 0  Suicidal thoughts 0 0 0 0 0  PHQ-9 Score 9 10 6 5 7   Difficult doing work/chores  Somewhat difficult Somewhat difficult Somewhat difficult Somewhat difficult     Relevant past medical, surgical, family and social history reviewed and updated as indicated. Interim medical history since our last visit reviewed. Allergies and medications reviewed and updated.  Review of Systems  Per HPI unless specifically indicated above     Objective:    BP 117/79   Pulse 80   Temp 97.7 F (36.5 C) (Oral)   Ht 5' 10.08" (1.78 m)   Wt 238 lb (108 kg)   SpO2 98%   BMI 34.07 kg/m   Wt Readings from Last 3 Encounters:  09/09/22 238 lb (108 kg)  08/07/22 226 lb 14.4 oz (102.9  kg)  01/21/22 225 lb 12.8 oz (102.4 kg)    Physical Exam Constitutional:      General: He is not in acute distress.    Appearance: Normal appearance. He is well-developed.  HENT:     Head: Normocephalic and atraumatic.  Eyes:     General: Lids are normal. No scleral icterus.       Right eye: No discharge.        Left eye: No discharge.     Conjunctiva/sclera: Conjunctivae normal.  Neck:     Vascular: No carotid bruit or JVD.  Cardiovascular:     Rate and Rhythm: Normal rate and regular rhythm.     Heart sounds: Normal heart sounds.  Pulmonary:     Effort: Pulmonary effort is normal. No respiratory distress.     Breath sounds: Normal breath sounds.  Abdominal:     Palpations: There is no hepatomegaly or splenomegaly.  Musculoskeletal:        General: Normal range of motion.     Cervical back: Normal range of motion and neck supple.  Skin:    General: Skin is warm and dry.     Coloration: Skin is not pale.     Findings: No rash.  Neurological:     Mental Status: He is alert and oriented to person, place, and time.  Psychiatric:        Behavior: Behavior normal.        Thought Content: Thought content normal.        Judgment: Judgment normal.     Results for orders placed or performed in visit on 01/20/22  TSH  Result Value Ref Range   TSH 2.690 0.450 - 4.500 uIU/mL  Urinalysis, Routine w reflex microscopic  Result Value Ref Range   Specific Gravity, UA >1.030 (H) 1.005 - 1.030   pH, UA 5.5 5.0 - 7.5   Color, UA Yellow Yellow   Appearance Ur Clear Clear   Leukocytes,UA Negative Negative   Protein,UA Negative Negative/Trace   Glucose, UA Negative Negative   Ketones, UA Negative Negative   RBC, UA Negative Negative   Bilirubin, UA Negative Negative   Urobilinogen, Ur 0.2 0.2 - 1.0 mg/dL   Nitrite, UA Negative Negative  Lipid panel  Result Value Ref Range   Cholesterol, Total 187 100 - 199 mg/dL   Triglycerides 129 0 - 149 mg/dL   HDL 46 >39 mg/dL   VLDL  Cholesterol Cal 23 5 - 40 mg/dL   LDL Chol Calc (NIH) 118 (H) 0 - 99 mg/dL   Chol/HDL Ratio 4.1 0.0 - 5.0 ratio  Comprehensive metabolic panel  Result Value Ref Range   Glucose 102 (H) 70 - 99 mg/dL   BUN 14 8 - 27 mg/dL   Creatinine, Ser 0.98 0.76 - 1.27 mg/dL   eGFR 87 >59 mL/min/1.73   BUN/Creatinine Ratio 14 10 - 24   Sodium 139 134 - 144 mmol/L   Potassium 4.3 3.5 - 5.2 mmol/L   Chloride 103 96 - 106 mmol/L   CO2 24 20 - 29 mmol/L   Calcium 9.1 8.6 - 10.2 mg/dL   Total Protein 6.4 6.0 - 8.5 g/dL   Albumin 3.9 3.8 - 4.8 g/dL   Globulin, Total 2.5 1.5 - 4.5 g/dL   Albumin/Globulin Ratio 1.6 1.2 - 2.2   Bilirubin Total <0.2 0.0 - 1.2 mg/dL   Alkaline Phosphatase 71 44 - 121 IU/L   AST 18 0 - 40 IU/L   ALT 20 0 - 44 IU/L  CBC with Differential/Platelet  Result Value Ref Range   WBC 6.1 3.4 - 10.8 x10E3/uL   RBC 4.33 4.14 - 5.80 x10E6/uL   Hemoglobin 13.2 13.0 - 17.7 g/dL   Hematocrit 39.4 37.5 - 51.0 %   MCV 91 79 - 97 fL   MCH 30.5 26.6 - 33.0 pg   MCHC 33.5 31.5 - 35.7 g/dL   RDW 12.9 11.6 - 15.4 %   Platelets 217 150 - 450 x10E3/uL   Neutrophils 57 Not Estab. %   Lymphs 29 Not Estab. %   Monocytes 8 Not Estab. %   Eos 4 Not Estab. %   Basos 1 Not Estab. %   Neutrophils Absolute 3.5 1.4 - 7.0 x10E3/uL   Lymphocytes Absolute 1.8 0.7 - 3.1 x10E3/uL   Monocytes Absolute 0.5 0.1 - 0.9 x10E3/uL   EOS (ABSOLUTE) 0.3 0.0 - 0.4 x10E3/uL   Basophils Absolute 0.1 0.0 - 0.2 x10E3/uL   Immature Granulocytes 1 Not Estab. %   Immature Grans (Abs) 0.1 0.0 - 0.1 x10E3/uL  Assessment & Plan:   Problem List Items Addressed This Visit       Unprioritized   Depression, recurrent (Peter Bennett) - Primary    On Fluoxetine 40 mg.  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  Not waking up rested.        Hyperlipidemia    On a statin.  Check labs today      Relevant Orders   Comprehensive metabolic panel   Lipid  Panel w/o Chol/HDL Ratio   Hypertension    Stable, continue present medications.        Relevant Orders   CBC with Differential/Platelet   Neuropathy    Taking Gabapentin.  Feeling it causes weight gain.  Seeing neurology      Relevant Orders   Vitamin B12   Magnesium   Obesity    Recent weight gain.  Discussed diet.        Sleep apnea    Not using CPAP.  Hasn't used it in years.  Doesn't feel able to go through the process at this time.        Other Visit Diagnoses     Benign prostatic hyperplasia without lower urinary tract symptoms       PSA check on labs today.   Relevant Orders   PSA   Need for hepatitis C screening test       Hep C screen on labs today per guidelines for one time screening, discussed with patient.   Relevant Orders   Hepatitis C antibody   Encounter for screening for HIV       HIV screen on labs today per guidelines for one time screening, discussed with patient.   Relevant Orders   HIV Antibody (routine testing w rflx)        Follow up plan: Return in about 6 months (around 03/10/2023).

## 2022-09-10 LAB — LIPID PANEL W/O CHOL/HDL RATIO
Cholesterol, Total: 181 mg/dL (ref 100–199)
HDL: 31 mg/dL — ABNORMAL LOW (ref >39)
LDL Chol Calc (NIH): 113 mg/dL — ABNORMAL HIGH (ref 0–99)
Triglycerides: 208 mg/dL — ABNORMAL HIGH (ref 0–149)
VLDL Cholesterol Cal: 37 mg/dL (ref 5–40)

## 2022-09-10 LAB — CBC WITH DIFFERENTIAL/PLATELET
Basophils Absolute: 0.1 10*3/uL (ref 0.0–0.2)
Basos: 1 %
EOS (ABSOLUTE): 0.2 10*3/uL (ref 0.0–0.4)
Eos: 4 %
Hematocrit: 42 % (ref 37.5–51.0)
Hemoglobin: 14 g/dL (ref 13.0–17.7)
Immature Grans (Abs): 0 10*3/uL (ref 0.0–0.1)
Immature Granulocytes: 0 %
Lymphocytes Absolute: 1.8 10*3/uL (ref 0.7–3.1)
Lymphs: 33 %
MCH: 30.4 pg (ref 26.6–33.0)
MCHC: 33.3 g/dL (ref 31.5–35.7)
MCV: 91 fL (ref 79–97)
Monocytes Absolute: 0.7 10*3/uL (ref 0.1–0.9)
Monocytes: 12 %
Neutrophils Absolute: 2.7 10*3/uL (ref 1.4–7.0)
Neutrophils: 50 %
Platelets: 215 10*3/uL (ref 150–450)
RBC: 4.6 x10E6/uL (ref 4.14–5.80)
RDW: 12.9 % (ref 11.6–15.4)
WBC: 5.6 10*3/uL (ref 3.4–10.8)

## 2022-09-10 LAB — HEPATITIS C ANTIBODY: Hep C Virus Ab: NONREACTIVE

## 2022-09-10 LAB — COMPREHENSIVE METABOLIC PANEL
ALT: 22 IU/L (ref 0–44)
AST: 24 IU/L (ref 0–40)
Albumin/Globulin Ratio: 1.7 (ref 1.2–2.2)
Albumin: 4.3 g/dL (ref 3.9–4.9)
Alkaline Phosphatase: 75 IU/L (ref 44–121)
BUN/Creatinine Ratio: 20 (ref 10–24)
BUN: 19 mg/dL (ref 8–27)
Bilirubin Total: 0.2 mg/dL (ref 0.0–1.2)
CO2: 26 mmol/L (ref 20–29)
Calcium: 9.1 mg/dL (ref 8.6–10.2)
Chloride: 99 mmol/L (ref 96–106)
Creatinine, Ser: 0.93 mg/dL (ref 0.76–1.27)
Globulin, Total: 2.5 g/dL (ref 1.5–4.5)
Glucose: 89 mg/dL (ref 70–99)
Potassium: 4.5 mmol/L (ref 3.5–5.2)
Sodium: 137 mmol/L (ref 134–144)
Total Protein: 6.8 g/dL (ref 6.0–8.5)
eGFR: 92 mL/min/{1.73_m2} (ref >59)

## 2022-09-10 LAB — MAGNESIUM: Magnesium: 2.2 mg/dL (ref 1.6–2.3)

## 2022-09-10 LAB — HIV ANTIBODY (ROUTINE TESTING W REFLEX): HIV Screen 4th Generation wRfx: NONREACTIVE

## 2022-09-10 LAB — VITAMIN B12: Vitamin B-12: 580 pg/mL (ref 232–1245)

## 2022-09-10 LAB — HGB A1C W/O EAG: Hgb A1c MFr Bld: 5.8 % — ABNORMAL HIGH (ref 4.8–5.6)

## 2022-09-10 LAB — PSA: Prostate Specific Ag, Serum: 1.3 ng/mL (ref 0.0–4.0)

## 2022-09-16 ENCOUNTER — Other Ambulatory Visit: Payer: Self-pay | Admitting: Physician Assistant

## 2022-09-16 DIAGNOSIS — R202 Paresthesia of skin: Secondary | ICD-10-CM | POA: Diagnosis not present

## 2022-09-16 DIAGNOSIS — F339 Major depressive disorder, recurrent, unspecified: Secondary | ICD-10-CM

## 2022-09-16 DIAGNOSIS — R200 Anesthesia of skin: Secondary | ICD-10-CM | POA: Diagnosis not present

## 2022-09-16 NOTE — Telephone Encounter (Signed)
Requested Prescriptions  Refused Prescriptions Disp Refills   FLUoxetine (PROZAC) 40 MG capsule [Pharmacy Med Name: FLUOXETINE HCL 40 MG CAPSULE] 30 capsule 0    Sig: Take 1 capsule (40 mg total) by mouth daily.     Psychiatry:  Antidepressants - SSRI Passed - 09/16/2022 10:45 AM      Passed - Completed PHQ-2 or PHQ-9 in the last 360 days      Passed - Valid encounter within last 6 months    Recent Outpatient Visits           1 week ago Depression, recurrent ([place])   [place] Kathrine Haddock, NP   1 month ago Depression, recurrent ([place])   [place], Dani Gobble, PA-C   7 months ago Primary hypertension   [place] Vigg, Avanti, MD   9 months ago Primary hypertension   [place] Vigg, Avanti, MD   1 year ago Screening for colon cancer   [place] Vigg, Avanti, MD       Future Appointments             In 5 months Cannady, Barbaraann Faster, NP [place], [facility]

## 2022-10-01 DIAGNOSIS — E669 Obesity, unspecified: Secondary | ICD-10-CM | POA: Diagnosis not present

## 2022-10-01 DIAGNOSIS — M1712 Unilateral primary osteoarthritis, left knee: Secondary | ICD-10-CM | POA: Diagnosis not present

## 2022-10-18 ENCOUNTER — Emergency Department
Admission: EM | Admit: 2022-10-18 | Discharge: 2022-10-19 | Disposition: A | Discharge status: Home / Self Care | Payer: 59 | Attending: Student in an Organized Health Care Education/Training Program | Admitting: Student in an Organized Health Care Education/Training Program

## 2022-10-18 ENCOUNTER — Emergency Department: Payer: 59

## 2022-10-18 ENCOUNTER — Encounter: Payer: Self-pay | Admitting: Emergency Medicine

## 2022-10-18 ENCOUNTER — Other Ambulatory Visit: Payer: Self-pay

## 2022-10-18 DIAGNOSIS — R110 Nausea: Secondary | ICD-10-CM | POA: Diagnosis not present

## 2022-10-18 DIAGNOSIS — N200 Calculus of kidney: Secondary | ICD-10-CM | POA: Diagnosis not present

## 2022-10-18 DIAGNOSIS — R1031 Right lower quadrant pain: Secondary | ICD-10-CM

## 2022-10-18 DIAGNOSIS — I700 Atherosclerosis of aorta: Secondary | ICD-10-CM | POA: Diagnosis not present

## 2022-10-18 DIAGNOSIS — N201 Calculus of ureter: Secondary | ICD-10-CM | POA: Diagnosis not present

## 2022-10-18 DIAGNOSIS — Z743 Need for continuous supervision: Secondary | ICD-10-CM | POA: Diagnosis not present

## 2022-10-18 DIAGNOSIS — R1084 Generalized abdominal pain: Secondary | ICD-10-CM | POA: Diagnosis not present

## 2022-10-18 LAB — COMPREHENSIVE METABOLIC PANEL
ALT: 25 U/L (ref 0–44)
AST: 25 U/L (ref 15–41)
Albumin: 4 g/dL (ref 3.5–5.0)
Alkaline Phosphatase: 57 U/L (ref 38–126)
Anion gap: 6 (ref 5–15)
BUN: 26 mg/dL — ABNORMAL HIGH (ref 8–23)
CO2: 29 mmol/L (ref 22–32)
Calcium: 8.8 mg/dL — ABNORMAL LOW (ref 8.9–10.3)
Chloride: 101 mmol/L (ref 98–111)
Creatinine, Ser: 1.35 mg/dL — ABNORMAL HIGH (ref 0.61–1.24)
GFR, Estimated: 59 mL/min — ABNORMAL LOW (ref >60)
Glucose, Bld: 128 mg/dL — ABNORMAL HIGH (ref 70–99)
Potassium: 3.8 mmol/L (ref 3.5–5.1)
Sodium: 136 mmol/L (ref 135–145)
Total Bilirubin: 0.7 mg/dL (ref 0.3–1.2)
Total Protein: 7.6 g/dL (ref 6.5–8.1)

## 2022-10-18 LAB — CBC
HCT: 40.2 % (ref 39.0–52.0)
Hemoglobin: 13.2 g/dL (ref 13.0–17.0)
MCH: 29.9 pg (ref 26.0–34.0)
MCHC: 32.8 g/dL (ref 30.0–36.0)
MCV: 91.2 fL (ref 80.0–100.0)
Platelets: 204 10*3/uL (ref 150–400)
RBC: 4.41 MIL/uL (ref 4.22–5.81)
RDW: 13.1 % (ref 11.5–15.5)
WBC: 9.1 10*3/uL (ref 4.0–10.5)
nRBC: 0 % (ref 0.0–0.2)

## 2022-10-18 LAB — URINALYSIS, ROUTINE W REFLEX MICROSCOPIC
Bilirubin Urine: NEGATIVE
Glucose, UA: NEGATIVE mg/dL
Hgb urine dipstick: NEGATIVE
Ketones, ur: NEGATIVE mg/dL
Leukocytes,Ua: NEGATIVE
Nitrite: NEGATIVE
Protein, ur: NEGATIVE mg/dL
Specific Gravity, Urine: 1.026 (ref 1.005–1.030)
pH: 5 (ref 5.0–8.0)

## 2022-10-18 LAB — LIPASE, BLOOD: Lipase: 38 U/L (ref 11–51)

## 2022-10-18 MED ORDER — IOHEXOL 350 MG/ML SOLN
100.0000 mL | Freq: Once | INTRAVENOUS | Status: AC | PRN
  Administered 2022-10-18: 100 mL via INTRAVENOUS

## 2022-10-18 MED ORDER — ONDANSETRON HCL 4 MG/2ML IJ SOLN
4.0000 mg | Freq: Once | INTRAMUSCULAR | Status: AC
  Administered 2022-10-18: 4 mg via INTRAVENOUS
  Filled 2022-10-18: qty 2

## 2022-10-18 MED ORDER — SODIUM CHLORIDE 0.9 % IV BOLUS
1000.0000 mL | Freq: Once | INTRAVENOUS | Status: AC
  Administered 2022-10-18: 1000 mL via INTRAVENOUS

## 2022-10-18 MED ORDER — KETOROLAC TROMETHAMINE 30 MG/ML IJ SOLN
15.0000 mg | Freq: Once | INTRAMUSCULAR | Status: AC
  Administered 2022-10-18: 15 mg via INTRAVENOUS
  Filled 2022-10-18: qty 1

## 2022-10-18 MED ORDER — MORPHINE SULFATE (PF) 4 MG/ML IV SOLN
4.0000 mg | INTRAVENOUS | Status: DC | PRN
  Administered 2022-10-18: 4 mg via INTRAVENOUS
  Filled 2022-10-18: qty 1

## 2022-10-18 MED ORDER — IOHEXOL 300 MG/ML  SOLN: 100.0000 mL | Freq: Once | INTRAMUSCULAR | Status: DC | PRN

## 2022-10-18 NOTE — ED Notes (Signed)
Pt transported to CT ?

## 2022-10-18 NOTE — ED Notes (Signed)
EDP at bedside  

## 2022-10-18 NOTE — ED Triage Notes (Addendum)
BIB EMS from home for new onset abd pain tonight (RLQ, sharp, worse with movement); V/D x 1. Denies emesis  EMS exam: Afebrile, 156CBG, 76bpm, 99%RA, 45CO2, 156/76, received '4mg'$  zofran, 173mg fentanyl (pain improved 7/10 to 6/10 after)  No abd surgical hx No h/o kidney stone Last BM today  18G L AC by EMS  H/o htn

## 2022-10-18 NOTE — ED Notes (Signed)
Pt is aware of need for urine sample, urinal at bedside

## 2022-10-18 NOTE — ED Provider Notes (Signed)
[facility] Provider Note    Event Date/Time   First MD Initiated Contact with Patient 10/18/22 2259     (approximate)   History   No chief complaint on file.   HPI  Peter Bennett is a 62 y.o. male no significant past medical history presents to the ER for evaluation of acute onset right lower quadrant pain.  He is having trouble finding a comfortable position.  Denies any fevers or chills is having some nausea.  No pain radiating to his groin does have some right flank pain.  No dysuria no hematuria no history of kidney stones no previous abdominal surgeries.  Patient rates the pain as moderate to severe.     Physical Exam   Triage Vital Signs: ED Triage Vitals [10/18/22 2143]  Enc Vitals Group     BP (!) 153/87     Pulse Rate 69     Resp 18     Temp 98 F (36.7 C)     Temp Source Oral     SpO2 97 %     Weight 242 lb 8.1 oz (110 kg)     Height      Head Circumference      Peak Flow      Pain Score      Pain Loc      Pain Edu?      Excl. in [place]?     Most recent vital signs: Vitals:   10/19/22 0100 10/19/22 0130  BP: 124/69   Pulse: 87   Resp: 14   Temp:  98 F (36.7 C)  SpO2: 99%      Constitutional: Alert  Eyes: Conjunctivae are normal.  Head: Atraumatic. Nose: No congestion/rhinnorhea. Mouth/Throat: Mucous membranes are moist.   Neck: Painless ROM.  Cardiovascular:   Good peripheral circulation. Respiratory: Normal respiratory effort.  No retractions.  Gastrointestinal: Soft with mild right lower quadrant tenderness to palpation no guarding or rebound. Musculoskeletal:  no deformity Neurologic:  MAE spontaneously. No gross focal neurologic deficits are appreciated.  Skin:  Skin is warm, dry and intact. No rash noted. Psychiatric: Mood and affect are normal. Speech and behavior are normal.    ED Results / Procedures / Treatments   Labs (all labs ordered are listed, but only abnormal results are displayed) Labs  Reviewed  COMPREHENSIVE METABOLIC PANEL - Abnormal; Notable for the following components:      Result Value   Glucose, Bld 128 (*)    BUN 26 (*)    Creatinine, Ser 1.35 (*)    Calcium 8.8 (*)    GFR, Estimated 59 (*)    All other components within normal limits  URINALYSIS, ROUTINE W REFLEX MICROSCOPIC - Abnormal; Notable for the following components:   Color, Urine YELLOW (*)    APPearance HAZY (*)    All other components within normal limits  LIPASE, BLOOD  CBC     EKG     RADIOLOGY Please see ED Course for my review and interpretation.  I personally reviewed all radiographic images ordered to evaluate for the above acute complaints and reviewed radiology reports and findings.  These findings were personally discussed with the patient.  Please see medical record for radiology report.    PROCEDURES:  Critical Care performed: No  Procedures   MEDICATIONS ORDERED IN ED: Medications  morphine (PF) 4 MG/ML injection 4 mg (4 mg Intravenous Given 10/18/22 2309)  ondansetron (ZOFRAN) injection 4 mg (4 mg Intravenous Given 10/18/22  2308)  sodium chloride 0.9 % bolus 1,000 mL (0 mLs Intravenous Stopped 10/19/22 0028)  iohexol (OMNIPAQUE) 350 MG/ML injection 100 mL (100 mLs Intravenous Contrast Given 10/18/22 2339)  ketorolac (TORADOL) 30 MG/ML injection 15 mg (15 mg Intravenous Given 10/18/22 2359)  oxyCODONE-acetaminophen (PERCOCET/ROXICET) 5-325 MG per tablet 1 tablet (1 tablet Oral Given 10/19/22 0034)     IMPRESSION / MDM / ASSESSMENT AND PLAN / ED COURSE  I reviewed the triage vital signs and the nursing notes.                              Differential diagnosis includes, but is not limited to, kidney stone, pyelonephritis, hernia, appendicitis, colitis, diverticulitis  Patient presenting to the ER for evaluation of symptoms as described above.  Based on symptoms, risk factors and considered above differential, this presenting complaint could reflect a potentially  life-threatening illness therefore the patient will be placed on continuous pulse oximetry and telemetry for monitoring.  Laboratory evaluation will be sent to evaluate for the above complaints.  Will order IV morphine and IV Zofran IV fluids will order CT imaging to evaluate for the above differential.   Clinical Course as of 10/19/22 0528  Sat Oct 18, 2022  2358 CT imaging on my review and interpretation with evidence of right-sided stone.  Per radiology no acute findings no appendicitis.  Urinalysis without evidence of infection.  Will give Toradol. [PR]  Sun Oct 19, 2022  0116 Patient reassessed.  Pain significantly improved.  Does appear stable appropriate for outpatient follow-up.  We discussed signs and symptoms which he should return to the ER.   [PR]    Clinical Course User Index [PR] Merlyn Lot, MD    FINAL CLINICAL IMPRESSION(S) / ED DIAGNOSES   Final diagnoses:  RLQ abdominal pain  Ureterolithiasis     Rx / DC Orders   ED Discharge Orders          Ordered    oxyCODONE-acetaminophen (PERCOCET) 5-325 MG tablet  Every 4 hours PRN        10/19/22 0119    tamsulosin (FLOMAX) 0.4 MG CAPS capsule  Daily after supper        10/19/22 0119    ondansetron (ZOFRAN-ODT) 4 MG disintegrating tablet  Every 8 hours PRN        10/19/22 0119             Note:  This document was prepared using Dragon voice recognition software and may include unintentional dictation errors.    Merlyn Lot, MD 10/19/22 [phone]

## 2022-10-19 MED ORDER — TAMSULOSIN HCL 0.4 MG PO CAPS: 0.4000 mg | ORAL_CAPSULE | Freq: Every day | ORAL | 0 refills | Status: DC

## 2022-10-19 MED ORDER — ONDANSETRON 4 MG PO TBDP: 4.0000 mg | ORAL_TABLET | Freq: Three times a day (TID) | ORAL | 0 refills | Status: DC | PRN

## 2022-10-19 MED ORDER — OXYCODONE-ACETAMINOPHEN 5-325 MG PO TABS: 1.0000 | ORAL_TABLET | ORAL | 0 refills | Status: DC | PRN

## 2022-10-19 MED ORDER — OXYCODONE-ACETAMINOPHEN 5-325 MG PO TABS
1.0000 | ORAL_TABLET | Freq: Once | ORAL | Status: AC
  Administered 2022-10-19: 1 via ORAL
  Filled 2022-10-19: qty 1

## 2022-11-10 ENCOUNTER — Other Ambulatory Visit: Payer: Self-pay | Admitting: Physician Assistant

## 2022-11-10 DIAGNOSIS — F339 Major depressive disorder, recurrent, unspecified: Secondary | ICD-10-CM

## 2022-11-10 NOTE — Telephone Encounter (Signed)
Requested medication (s) are due for refill today: yes  Requested medication (s) are on the active medication list: yes  Last refill:  08/07/22 #90/0  Future visit scheduled: yes  Notes to clinic:  meets protocol but PCP is Malachy Mood, has upcoming appt with Jolene    Requested Prescriptions  Pending Prescriptions Disp Refills   FLUoxetine (PROZAC) 40 MG capsule [Pharmacy Med Name: FLUOXETINE HCL 40 MG CAPSULE] 60 capsule 0    Sig: Take 1 capsule (40 mg total) by mouth daily.     Psychiatry:  Antidepressants - SSRI Passed - 11/10/2022 11:39 AM      Passed - Completed PHQ-2 or PHQ-9 in the last 360 days      Passed - Valid encounter within last 6 months    Recent Outpatient Visits           2 months ago Depression, recurrent ([place])   [place] Kathrine Haddock, NP   3 months ago Depression, recurrent ([place])   [place], Dani Gobble, PA-C   9 months ago Primary hypertension   [place] Vigg, Avanti, MD   10 months ago Primary hypertension   [place] Vigg, Avanti, MD   1 year ago Screening for colon cancer   [place] Vigg, Avanti, MD       Future Appointments             In 4 months Cannady, Barbaraann Faster, NP [place], [facility]

## 2022-11-27 ENCOUNTER — Telehealth: Payer: Self-pay

## 2022-11-27 NOTE — Telephone Encounter (Signed)
Received referral to ED. Pt has insurance and in eligible for [facility].

## 2022-12-25 DIAGNOSIS — R202 Paresthesia of skin: Secondary | ICD-10-CM | POA: Diagnosis not present

## 2022-12-25 DIAGNOSIS — G629 Polyneuropathy, unspecified: Secondary | ICD-10-CM | POA: Diagnosis not present

## 2022-12-25 DIAGNOSIS — R208 Other disturbances of skin sensation: Secondary | ICD-10-CM | POA: Diagnosis not present

## 2022-12-25 DIAGNOSIS — R200 Anesthesia of skin: Secondary | ICD-10-CM | POA: Diagnosis not present

## 2022-12-28 DIAGNOSIS — R7309 Other abnormal glucose: Secondary | ICD-10-CM | POA: Insufficient documentation

## 2022-12-28 NOTE — Patient Instructions (Signed)

## 2022-12-31 ENCOUNTER — Ambulatory Visit: Payer: 59 | Admitting: Nurse Practitioner

## 2022-12-31 ENCOUNTER — Encounter: Payer: Self-pay | Admitting: Nurse Practitioner

## 2022-12-31 VITALS — BP 126/68 | HR 80 | Temp 97.8°F | Ht 70.08 in | Wt 238.2 lb

## 2022-12-31 DIAGNOSIS — R7309 Other abnormal glucose: Secondary | ICD-10-CM

## 2022-12-31 DIAGNOSIS — G629 Polyneuropathy, unspecified: Secondary | ICD-10-CM

## 2022-12-31 DIAGNOSIS — E6609 Other obesity due to excess calories: Secondary | ICD-10-CM | POA: Diagnosis not present

## 2022-12-31 DIAGNOSIS — I10 Essential (primary) hypertension: Secondary | ICD-10-CM | POA: Diagnosis not present

## 2022-12-31 DIAGNOSIS — Z6830 Body mass index (BMI) 30.0-30.9, adult: Secondary | ICD-10-CM

## 2022-12-31 DIAGNOSIS — G4733 Obstructive sleep apnea (adult) (pediatric): Secondary | ICD-10-CM | POA: Diagnosis not present

## 2022-12-31 DIAGNOSIS — F339 Major depressive disorder, recurrent, unspecified: Secondary | ICD-10-CM

## 2022-12-31 DIAGNOSIS — E782 Mixed hyperlipidemia: Secondary | ICD-10-CM | POA: Diagnosis not present

## 2022-12-31 MED ORDER — BENAZEPRIL HCL 20 MG PO TABS: 20.0000 mg | ORAL_TABLET | Freq: Every day | ORAL | 4 refills | Status: DC

## 2022-12-31 MED ORDER — ROSUVASTATIN CALCIUM 10 MG PO TABS: 10.0000 mg | ORAL_TABLET | Freq: Every day | ORAL | 4 refills | Status: DC

## 2022-12-31 MED ORDER — BUPROPION HCL ER (XL) 150 MG PO TB24: 150.0000 mg | ORAL_TABLET | Freq: Every day | ORAL | 4 refills | Status: DC

## 2022-12-31 MED ORDER — FLUOXETINE HCL 40 MG PO CAPS: 40.0000 mg | ORAL_CAPSULE | Freq: Every day | ORAL | 4 refills | Status: DC

## 2022-12-31 MED ORDER — HYDROCHLOROTHIAZIDE 12.5 MG PO TABS: 12.5000 mg | ORAL_TABLET | Freq: Every day | ORAL | 4 refills | Status: DC

## 2022-12-31 NOTE — Assessment & Plan Note (Signed)
OSA on past testing, has not used CPAP in years.  ?cause of increased daytime fatigue. Discussed at length with patient.  Will send for repeat sleep study and then he can discuss options available for masks now.  He would prefer home study.

## 2022-12-31 NOTE — Assessment & Plan Note (Addendum)
Chronic, ongoing.  Denies SI/HI.  Some anhedonia and fatigue, loss of motivation.  Will continue Prozac at 40 MG, but trial adding on Wellbutrin XL 150 MG to see if benefit to fatigue and anhedonia that is present.  Discussed at length with patient medication and side effects.  Aware of BLACK BOX warning and to report immediately if SI presents.  Return in 6 weeks.  Will check testosterone and Vitamin D as well.

## 2022-12-31 NOTE — Progress Notes (Signed)
BP 126/68 (BP Location: Left Arm, Patient Position: Sitting, Cuff Size: Normal)   Pulse 80   Temp 97.8 F (36.6 C) (Oral)   Ht 5' 10.08" (1.78 m)   Wt 238 lb 3.2 oz (108 kg)   SpO2 97%   BMI 34.10 kg/m    Subjective:    Patient ID: Peter Bennett, male    DOB: 1961/02/02, 62 y.o.   MRN: [id number]  HPI: Peter Bennett is a 62 y.o. male  Chief Complaint  Patient presents with   Diabetes   Medication Refill   Hypertension   Hyperlipidemia   HYPERTENSION / HYPERLIPIDEMIA Continues on Benazepril, HCTZ, and Rosuvastatin -- ASA.  Has diagnosis of OSA, does not use CPAP -  reports it got on his nerves, felt like he did not get sleep because mask was always getting in way. Satisfied with current treatment? yes Duration of hypertension: chronic BP monitoring frequency: rarely BP range:  BP medication side effects: no Duration of hyperlipidemia: chronic Cholesterol medication side effects: no Cholesterol supplements: none Medication compliance: good compliance Aspirin: yes Recent stressors: no Recurrent headaches: no Visual changes: no Palpitations: no Dyspnea: no Chest pain: no Lower extremity edema: no Dizzy/lightheaded: no  The 10-year ASCVD risk score (Arnett DK, et al., 2019) is: 15.4%   Values used to calculate the score:     Age: 62 years     Sex: Male     Is Non-Hispanic African American: No     Diabetic: No     Tobacco smoker: No     Systolic Blood Pressure: 126 mmHg     Is BP treated: Yes     HDL Cholesterol: 31 mg/dL     Total Cholesterol: 181 mg/dL   IFG Last W0J 8.1% January.  Is followed by neurology for neuropathy pain, last visit was 12/25/2022.  Taking Gabapentin for discomfort. Polydipsia/polyuria: no Visual disturbance: no Chest pain: no Paresthesias: no  DEPRESSION Taking Prozac daily.  Does endorse feeling depressed, can't catch a break.   Mood status: uncontrolled Satisfied with current treatment?: yes Symptom severity: moderate   Duration of current treatment : chronic Side effects: no Medication compliance: good compliance Psychotherapy/counseling: none Depressed mood: yes Anxious mood: yes Anhedonia: yes Significant weight loss or gain: no Insomnia: yes hard to stay asleep Fatigue: yes Feelings of worthlessness or guilt: yes Impaired concentration/indecisiveness: yes Suicidal ideations: no Hopelessness: yes Crying spells: no    12/31/2022    9:04 AM 09/09/2022    3:14 PM 08/07/2022    2:10 PM 01/21/2022    2:12 PM 12/19/2021    2:14 PM  Depression screen PHQ 2/9  Decreased Interest 1 1 1 1 1   Down, Depressed, Hopeless 1 1 1 1 1   PHQ - 2 Score 2 2 2 2 2   Altered sleeping 1 2 2 2 1   Tired, decreased energy 2 2 2 2 1   Change in appetite 1 1 1  0 0  Feeling bad or failure about yourself  1 1 1  0 1  Trouble concentrating 1 1 1  0 0  Moving slowly or fidgety/restless 1 0 1 0 0  Suicidal thoughts 0 0 0 0 0  PHQ-9 Score 9 9 10 6 5   Difficult doing work/chores Somewhat difficult  Somewhat difficult Somewhat difficult Somewhat difficult       12/31/2022    9:04 AM 08/07/2022    2:10 PM 01/21/2022    2:12 PM 12/19/2021    2:15 PM  GAD 7 :  Generalized Anxiety Score  Nervous, Anxious, on Edge 1 1 1 1   Control/stop worrying 0 1 1 1   Worry too much - different things 1 1 1 1   Trouble relaxing 1 1 1 1   Restless 1 1 0 0  Easily annoyed or irritable 1 1 1 1   Afraid - awful might happen 1  1 1   Total GAD 7 Score 6  6 6   Anxiety Difficulty Somewhat difficult Somewhat difficult Somewhat difficult Somewhat difficult    Relevant past medical, surgical, family and social history reviewed and updated as indicated. Interim medical history since our last visit reviewed. Allergies and medications reviewed and updated.  Review of Systems  Constitutional:  Positive for fatigue. Negative for activity change, diaphoresis and fever.  Respiratory:  Negative for cough, chest tightness, shortness of breath and wheezing.    Cardiovascular:  Negative for chest pain, palpitations and leg swelling.  Gastrointestinal: Negative.   Endocrine: Negative.   Neurological: Negative.   Psychiatric/Behavioral:  Positive for decreased concentration and sleep disturbance. Negative for hallucinations, self-injury and suicidal ideas. The patient is nervous/anxious. The patient is not hyperactive.     Per HPI unless specifically indicated above     Objective:    BP 126/68 (BP Location: Left Arm, Patient Position: Sitting, Cuff Size: Normal)   Pulse 80   Temp 97.8 F (36.6 C) (Oral)   Ht 5' 10.08" (1.78 m)   Wt 238 lb 3.2 oz (108 kg)   SpO2 97%   BMI 34.10 kg/m   Wt Readings from Last 3 Encounters:  12/31/22 238 lb 3.2 oz (108 kg)  10/18/22 242 lb 8.1 oz (110 kg)  09/09/22 238 lb (108 kg)    Physical Exam Vitals and nursing note reviewed.  Constitutional:      General: He is awake. He is not in acute distress.    Appearance: He is well-developed and well-groomed. He is obese. He is not ill-appearing or toxic-appearing.  HENT:     Head: Normocephalic.     Right Ear: Hearing and external ear normal.     Left Ear: Hearing and external ear normal.  Eyes:     General: Lids are normal.     Extraocular Movements: Extraocular movements intact.     Conjunctiva/sclera: Conjunctivae normal.  Neck:     Thyroid: No thyromegaly.     Vascular: No carotid bruit.  Cardiovascular:     Rate and Rhythm: Normal rate and regular rhythm.     Heart sounds: Normal heart sounds. No murmur heard.    No gallop.  Pulmonary:     Effort: No accessory muscle usage or respiratory distress.     Breath sounds: Normal breath sounds.  Abdominal:     General: Bowel sounds are normal. There is no distension.     Palpations: Abdomen is soft.     Tenderness: There is no abdominal tenderness.  Musculoskeletal:     Cervical back: Full passive range of motion without pain.     Right lower leg: No edema.     Left lower leg: No edema.   Lymphadenopathy:     Cervical: No cervical adenopathy.  Skin:    General: Skin is warm.     Capillary Refill: Capillary refill takes less than 2 seconds.  Neurological:     Mental Status: He is alert and oriented to person, place, and time.     Deep Tendon Reflexes: Reflexes are normal and symmetric.     Reflex Scores:  Brachioradialis reflexes are 2+ on the right side and 2+ on the left side.      Patellar reflexes are 2+ on the right side and 2+ on the left side. Psychiatric:        Attention and Perception: Attention normal.        Mood and Affect: Mood normal.        Speech: Speech normal.        Behavior: Behavior normal. Behavior is cooperative.        Thought Content: Thought content normal.     Results for orders placed or performed during the hospital encounter of 10/18/22  Lipase, blood  Result Value Ref Range   Lipase 38 11 - 51 U/L  Comprehensive metabolic panel  Result Value Ref Range   Sodium 136 135 - 145 mmol/L   Potassium 3.8 3.5 - 5.1 mmol/L   Chloride 101 98 - 111 mmol/L   CO2 29 22 - 32 mmol/L   Glucose, Bld 128 (H) 70 - 99 mg/dL   BUN 26 (H) 8 - 23 mg/dL   Creatinine, Ser 2.95 (H) 0.61 - 1.24 mg/dL   Calcium 8.8 (L) 8.9 - 10.3 mg/dL   Total Protein 7.6 6.5 - 8.1 g/dL   Albumin 4.0 3.5 - 5.0 g/dL   AST 25 15 - 41 U/L   ALT 25 0 - 44 U/L   Alkaline Phosphatase 57 38 - 126 U/L   Total Bilirubin 0.7 0.3 - 1.2 mg/dL   GFR, Estimated 59 (L) >60 mL/min   Anion gap 6 5 - 15  CBC  Result Value Ref Range   WBC 9.1 4.0 - 10.5 K/uL   RBC 4.41 4.22 - 5.81 MIL/uL   Hemoglobin 13.2 13.0 - 17.0 g/dL   HCT 62.1 30.8 - 65.7 %   MCV 91.2 80.0 - 100.0 fL   MCH 29.9 26.0 - 34.0 pg   MCHC 32.8 30.0 - 36.0 g/dL   RDW 84.6 96.2 - 95.2 %   Platelets 204 150 - 400 K/uL   nRBC 0.0 0.0 - 0.2 %  Urinalysis, Routine w reflex microscopic -Urine, Clean Catch  Result Value Ref Range   Color, Urine YELLOW (A) YELLOW   APPearance HAZY (A) CLEAR   Specific Gravity,  Urine 1.026 1.005 - 1.030   pH 5.0 5.0 - 8.0   Glucose, UA NEGATIVE NEGATIVE mg/dL   Hgb urine dipstick NEGATIVE NEGATIVE   Bilirubin Urine NEGATIVE NEGATIVE   Ketones, ur NEGATIVE NEGATIVE mg/dL   Protein, ur NEGATIVE NEGATIVE mg/dL   Nitrite NEGATIVE NEGATIVE   Leukocytes,Ua NEGATIVE NEGATIVE      Assessment & Plan:   Problem List Items Addressed This Visit       Cardiovascular and Mediastinum   Hypertension    Chronic, ongoing.  BP at goal on recheck today. Recommend she monitor BP at least a few mornings a week at home and document.  DASH diet at home.  Continue current medication regimen and adjust as needed.  Labs today: CMP, Lipid.  Refills sent in.        Relevant Medications   rosuvastatin (CRESTOR) 10 MG tablet   hydrochlorothiazide (HYDRODIURIL) 12.5 MG tablet   benazepril (LOTENSIN) 20 MG tablet   Other Relevant Orders   Comprehensive metabolic panel   TSH     Respiratory   Sleep apnea    OSA on past testing, has not used CPAP in years.  ?cause of increased daytime fatigue. Discussed at length with patient.  Will send for repeat sleep study and then he can discuss options available for masks now.  He would prefer home study.      Relevant Orders   Ambulatory referral to Sleep Studies     Nervous and Auditory   Neuropathy    Chronic, ongoing.  Followed by neurology, will continue this collaboration.  Recent notes reviewed and labs.        Other   Depression, recurrent (HCC) - Primary    Chronic, ongoing.  Denies SI/HI.  Some anhedonia and fatigue, loss of motivation.  Will continue Prozac at 40 MG, but trial adding on Wellbutrin XL 150 MG to see if benefit to fatigue and anhedonia that is present.  Discussed at length with patient medication and side effects.  Aware of BLACK BOX warning and to report immediately if SI presents.  Return in 6 weeks.  Will check testosterone and Vitamin D as well.      Relevant Medications   FLUoxetine (PROZAC) 40 MG capsule    buPROPion (WELLBUTRIN XL) 150 MG 24 hr tablet   Other Relevant Orders   Testosterone, free, total([facility])   VITAMIN D 25 Hydroxy (Vit-D Deficiency, Fractures)   Elevated hemoglobin A1c measurement    Last check 5.8%.  Diet controlled.  Will recheck today.  No symptoms.      Relevant Orders   HgB A1c   Hyperlipidemia    Chronic, ongoing.  Continue current medication regimen and adjust as needed.  Lipid panel today.          Relevant Medications   rosuvastatin (CRESTOR) 10 MG tablet   hydrochlorothiazide (HYDRODIURIL) 12.5 MG tablet   benazepril (LOTENSIN) 20 MG tablet   Other Relevant Orders   Comprehensive metabolic panel   Lipid Panel w/o Chol/HDL Ratio   Obesity    BMI 34.10.  Recommended eating smaller high protein, low fat meals more frequently and exercising 30 mins a day 5 times a week with a goal of 10-15lb weight loss in the next 3 months. Patient voiced their understanding and motivation to adhere to these recommendations.         Follow up plan: Return in about 6 weeks (around 02/11/2023) for DEPRSSION -- added Wellbutrin XL.

## 2022-12-31 NOTE — Assessment & Plan Note (Signed)
Chronic, ongoing.  Continue current medication regimen and adjust as needed. Lipid panel today. 

## 2022-12-31 NOTE — Assessment & Plan Note (Signed)
BMI 34.10.  Recommended eating smaller high protein, low fat meals more frequently and exercising 30 mins a day 5 times a week with a goal of 10-15lb weight loss in the next 3 months. Patient voiced their understanding and motivation to adhere to these recommendations.

## 2022-12-31 NOTE — Assessment & Plan Note (Signed)
Last check 5.8%.  Diet controlled.  Will recheck today.  No symptoms.

## 2022-12-31 NOTE — Assessment & Plan Note (Signed)
Chronic, ongoing.  BP at goal on recheck today. Recommend she monitor BP at least a few mornings a week at home and document.  DASH diet at home.  Continue current medication regimen and adjust as needed.  Labs today: CMP, Lipid.  Refills sent in.

## 2022-12-31 NOTE — Assessment & Plan Note (Signed)
Chronic, ongoing.  Followed by neurology, will continue this collaboration.  Recent notes reviewed and labs.

## 2023-01-01 ENCOUNTER — Other Ambulatory Visit: Payer: Self-pay | Admitting: Nurse Practitioner

## 2023-01-01 DIAGNOSIS — R7989 Other specified abnormal findings of blood chemistry: Secondary | ICD-10-CM | POA: Insufficient documentation

## 2023-01-01 MED ORDER — ROSUVASTATIN CALCIUM 20 MG PO TABS: 20.0000 mg | ORAL_TABLET | Freq: Every day | ORAL | 3 refills | Status: DC

## 2023-01-01 NOTE — Progress Notes (Signed)
Contacted via MyChart, however he does not consistently check this, so please call as well and needs lab visit only first thing in morning in 4 weeks: Good morning Daviyon, your labs have returned: - Cholesterol levels remain above goal, I am going to increase your Rosuvastatin to 20 MG daily for tighter control on current levels and better preventative heart care.  Stop the 10 MG dosing of Rosuvastatin.   - A1c remains in prediabetic range at 5.7% -- continue heavy focus on diet changes and regular exercise. - Kidney and liver function are normal. - Thyroid level and Vitamin D level normal. - For fatigue, your testosterone level is on lower side.  I would like to schedule you for lab only visit in 4 weeks to recheck this and if still low we will send you to urology to discuss treatments for this.  It could be cause of your fatigue.  Any questions? Keep being awesome!!  Thank you for allowing me to participate in your care.  I appreciate you. Kindest regards, Shaunika Italiano

## 2023-01-05 LAB — VITAMIN D 25 HYDROXY (VIT D DEFICIENCY, FRACTURES): Vit D, 25-Hydroxy: 46.2 ng/mL (ref 30.0–100.0)

## 2023-01-05 LAB — COMPREHENSIVE METABOLIC PANEL
ALT: 25 IU/L (ref 0–44)
AST: 24 IU/L (ref 0–40)
Albumin/Globulin Ratio: 1.7 (ref 1.2–2.2)
Albumin: 4.5 g/dL (ref 3.9–4.9)
Alkaline Phosphatase: 76 IU/L (ref 44–121)
BUN/Creatinine Ratio: 24 (ref 10–24)
BUN: 24 mg/dL (ref 8–27)
Bilirubin Total: 0.2 mg/dL (ref 0.0–1.2)
CO2: 23 mmol/L (ref 20–29)
Calcium: 9.6 mg/dL (ref 8.6–10.2)
Chloride: 100 mmol/L (ref 96–106)
Creatinine, Ser: 1 mg/dL (ref 0.76–1.27)
Globulin, Total: 2.6 g/dL (ref 1.5–4.5)
Glucose: 72 mg/dL (ref 70–99)
Potassium: 4.2 mmol/L (ref 3.5–5.2)
Sodium: 139 mmol/L (ref 134–144)
Total Protein: 7.1 g/dL (ref 6.0–8.5)
eGFR: 85 mL/min/{1.73_m2} (ref >59)

## 2023-01-05 LAB — LIPID PANEL W/O CHOL/HDL RATIO
Cholesterol, Total: 177 mg/dL (ref 100–199)
HDL: 37 mg/dL — ABNORMAL LOW (ref >39)
LDL Chol Calc (NIH): 98 mg/dL (ref 0–99)
Triglycerides: 247 mg/dL — ABNORMAL HIGH (ref 0–149)
VLDL Cholesterol Cal: 42 mg/dL — ABNORMAL HIGH (ref 5–40)

## 2023-01-05 LAB — HEMOGLOBIN A1C
Est. average glucose Bld gHb Est-mCnc: 117 mg/dL
Hgb A1c MFr Bld: 5.7 % — ABNORMAL HIGH (ref 4.8–5.6)

## 2023-01-05 LAB — TESTOSTERONE, FREE, TOTAL, SHBG
Sex Hormone Binding: 32.5 nmol/L (ref 19.3–76.4)
Testosterone, Free: 25.2 pg/mL — ABNORMAL HIGH (ref 6.6–18.1)
Testosterone: 231 ng/dL — ABNORMAL LOW (ref 264–916)

## 2023-01-05 LAB — TSH: TSH: 2.92 u[IU]/mL (ref 0.450–4.500)

## 2023-01-30 ENCOUNTER — Other Ambulatory Visit: Payer: 59

## 2023-01-30 DIAGNOSIS — R7989 Other specified abnormal findings of blood chemistry: Secondary | ICD-10-CM | POA: Diagnosis not present

## 2023-02-01 NOTE — Progress Notes (Signed)
Good morning, please let Peter Bennett know his labs have returned and testosterone level remains below 300.  I would recommend a visit to urology to discuss since he does have fatigue and lack of energy with this.  They can discuss supplements with him.  Would he like this referral?  Let me know.  Thanks!!  Have a great day!!

## 2023-02-02 ENCOUNTER — Other Ambulatory Visit: Payer: Self-pay | Admitting: Nurse Practitioner

## 2023-02-02 DIAGNOSIS — R7989 Other specified abnormal findings of blood chemistry: Secondary | ICD-10-CM

## 2023-02-04 LAB — TESTOSTERONE, FREE, TOTAL, SHBG
Sex Hormone Binding: 26.7 nmol/L (ref 19.3–76.4)
Testosterone, Free: 21.5 pg/mL — ABNORMAL HIGH (ref 6.6–18.1)
Testosterone: 280 ng/dL (ref 264–916)

## 2023-02-09 ENCOUNTER — Institutional Professional Consult (permissible substitution): Payer: Self-pay | Admitting: Neurology

## 2023-02-11 ENCOUNTER — Ambulatory Visit: Payer: 59 | Admitting: Nurse Practitioner

## 2023-02-27 ENCOUNTER — Ambulatory Visit: Payer: 59 | Admitting: Nurse Practitioner

## 2023-03-04 ENCOUNTER — Ambulatory Visit: Payer: 59 | Admitting: Urology

## 2023-03-08 NOTE — Patient Instructions (Signed)
Managing Depression, Adult Depression is a mental health condition that affects your thoughts, feelings, and actions. Being diagnosed with depression can bring you relief if you did not know why you have felt or behaved a certain way. It could also leave you feeling overwhelmed. Finding ways to manage your symptoms can help you feel more positive about your future. How to manage lifestyle changes Being depressed is difficult. Depression can increase the level of everyday stress. Stress can make depression symptoms worse. You may believe your symptoms cannot be managed or will never improve. However, there are many things you can try to help manage your symptoms. There is hope. Managing stress  Stress is your body's reaction to life changes and events, both good and bad. Stress can add to your feelings of depression. Learning to manage your stress can help lessen your feelings of depression. Try some of the following approaches to reducing your stress (stress reduction techniques): Listen to music that you enjoy and that inspires you. Try using a meditation app or take a meditation class. Develop a practice that helps you connect with your spiritual self. Walk in nature, pray, or go to a place of worship. Practice deep breathing. To do this, inhale slowly through your nose. Pause at the top of your inhale for a few seconds and then exhale slowly, letting yourself relax. Repeat this three or four times. Practice yoga to help relax and work your muscles. Choose a stress reduction technique that works for you. These techniques take time and practice to develop. Set aside 5-15 minutes a day to do them. Therapists can offer training in these techniques. Do these things to help manage stress: Keep a journal. Know your limits. Set healthy boundaries for yourself and others, such as saying "no" when you think something is too much. Pay attention to how you react to certain situations. You may not be able to  control everything, but you can change your reaction. Add humor to your life by watching funny movies or shows. Make time for activities that you enjoy and that relax you. Spend less time using electronics, especially at night before bed. The light from screens can make your brain think it is time to get up rather than go to bed.  Medicines Medicines, such as antidepressants, are often a part of treatment for depression. Talk with your pharmacist or health care provider about all the medicines, supplements, and herbal products that you take, their possible side effects, and what medicines and other products are safe to take together. Make sure to report any side effects you may have to your health care provider. Relationships Your health care provider may suggest family therapy, couples therapy, or individual therapy as part of your treatment. How to recognize changes Everyone responds differently to treatment for depression. As you recover from depression, you may start to: Have more interest in doing activities. Feel more hopeful. Have more energy. Eat a more regular amount of food. Have better mental focus. It is important to recognize if your depression is not getting better or is getting worse. The symptoms you had in the beginning may return, such as: Feeling tired. Eating too much or too little. Sleeping too much or too little. Feeling restless, agitated, or hopeless. Trouble focusing or making decisions. Having unexplained aches and pains. Feeling irritable, angry, or aggressive. If you or your family members notice these symptoms coming back, let your health care provider know right away. Follow these instructions at home: Activity Try to   get some form of exercise each day, such as walking. Try yoga, mindfulness, or other stress reduction techniques. Participate in group activities if you are able. Lifestyle Get enough sleep. Cut down on or stop using caffeine, tobacco,  alcohol, and any other harmful substances. Eat a healthy diet that includes plenty of vegetables, fruits, whole grains, low-fat dairy products, and lean protein. Limit foods that are high in solid fats, added sugar, or salt (sodium). General instructions Take over-the-counter and prescription medicines only as told by your health care provider. Keep all follow-up visits. It is important for your health care provider to check on your mood, behavior, and medicines. Your health care provider may need to make changes to your treatment. Where to find support Talking to others  Friends and family members can be sources of support and guidance. Talk to trusted friends or family members about your condition. Explain your symptoms and let them know that you are working with a health care provider to treat your depression. Tell friends and family how they can help. Finances Find mental health providers that fit with your financial situation. Talk with your health care provider if you are worried about access to food, housing, or medicine. Call your insurance company to learn about your co-pays and prescription plan. Where to find more information You can find support in your area from: Anxiety and Depression Association of America (ADAA): adaa.org Mental Health America: mentalhealthamerica.net National Alliance on Mental Illness: nami.org Contact a health care provider if: You stop taking your antidepressant medicines, and you have any of these symptoms: Nausea. Headache. Light-headedness. Chills and body aches. Not being able to sleep (insomnia). You or your friends and family think your depression is getting worse. Get help right away if: You have thoughts of hurting yourself or others. Get help right away if you feel like you may hurt yourself or others, or have thoughts about taking your own life. Go to your nearest emergency room or: Call 911. Call the National Suicide Prevention Lifeline at  1-800-273-8255 or 988. This is open 24 hours a day. Text the Crisis Text Line at 741741. This information is not intended to replace advice given to you by your health care provider. Make sure you discuss any questions you have with your health care provider. Document Revised: 12/10/2021 Document Reviewed: 12/10/2021 Elsevier Patient Education  2024 [employer]  

## 2023-03-10 ENCOUNTER — Ambulatory Visit: Payer: 59 | Admitting: Nurse Practitioner

## 2023-03-10 ENCOUNTER — Encounter: Payer: Self-pay | Admitting: Nurse Practitioner

## 2023-03-10 VITALS — BP 127/81 | HR 77 | Temp 97.9°F | Ht 70.08 in | Wt 236.4 lb

## 2023-03-10 DIAGNOSIS — F339 Major depressive disorder, recurrent, unspecified: Secondary | ICD-10-CM

## 2023-03-10 DIAGNOSIS — R7989 Other specified abnormal findings of blood chemistry: Secondary | ICD-10-CM | POA: Diagnosis not present

## 2023-03-10 MED ORDER — ROSUVASTATIN CALCIUM 20 MG PO TABS: 20.0000 mg | ORAL_TABLET | Freq: Every day | ORAL | 3 refills | Status: DC

## 2023-03-10 MED ORDER — BUPROPION HCL ER (XL) 150 MG PO TB24: 150.0000 mg | ORAL_TABLET | Freq: Every day | ORAL | 4 refills | Status: DC

## 2023-03-10 NOTE — Assessment & Plan Note (Signed)
Ongoing, not x 2 on labs <300.  Is scheduled to see urology upcoming, will review notes after his visit with them.  Reiterated importance of attending this, as suspect some of his symptoms related to low levels.

## 2023-03-10 NOTE — Progress Notes (Signed)
BP 127/81   Pulse 77   Temp 97.9 F (36.6 C) (Oral)   Ht 5' 10.08" (1.78 m)   Wt 236 lb 6.4 oz (107.2 kg)   SpO2 97%   BMI 33.84 kg/m    Subjective:    Patient ID: Peter Bennett, male    DOB: 1961-07-14, 62 y.o.   MRN: [id number]  HPI: ROSSI BURDO is a 62 y.o. male  Chief Complaint  Patient presents with   Anxiety   Depression   DEPRESSION Started Wellbutrin XL 150 MG on 12/31/22, he has had no ADR with this, but remains fatigued. Continues on Prozac.  His testosterone were low on recent checks, both <300, and is scheduled to see urology August 5th. He is very fatigued -- does not matter how much sleep he gets, remains tired.  A sleep study referral was placed last visit, he has not attended. Mood status: stable Satisfied with current treatment?: yes Symptom severity: moderate  Duration of current treatment : chronic Side effects: no Medication compliance: good compliance Psychotherapy/counseling: none Depressed mood: yes Anxious mood: yes Anhedonia: yes Significant weight loss or gain: no Insomnia: no -- sleeps too much Fatigue: yes Feelings of worthlessness or guilt: no Impaired concentration/indecisiveness: no Suicidal ideations: no Hopelessness: no Crying spells: no    03/10/2023    9:39 AM 12/31/2022    9:04 AM 09/09/2022    3:14 PM 08/07/2022    2:10 PM 01/21/2022    2:12 PM  Depression screen PHQ 2/9  Decreased Interest 2 1 1 1 1   Down, Depressed, Hopeless 1 1 1 1 1   PHQ - 2 Score 3 2 2 2 2   Altered sleeping 3 1 2 2 2   Tired, decreased energy 3 2 2 2 2   Change in appetite 1 1 1 1  0  Feeling bad or failure about yourself  1 1 1 1  0  Trouble concentrating 1 1 1 1  0  Moving slowly or fidgety/restless 1 1 0 1 0  Suicidal thoughts 0 0 0 0 0  PHQ-9 Score 13 9 9 10 6   Difficult doing work/chores Somewhat difficult Somewhat difficult  Somewhat difficult Somewhat difficult       03/10/2023    9:40 AM 12/31/2022    9:04 AM 08/07/2022    2:10 PM  01/21/2022    2:12 PM  GAD 7 : Generalized Anxiety Score  Nervous, Anxious, on Edge 1 1 1 1   Control/stop worrying 1 0 1 1  Worry too much - different things 1 1 1 1   Trouble relaxing 1 1 1 1   Restless 1 1 1  0  Easily annoyed or irritable 1 1 1 1   Afraid - awful might happen 1 1  1   Total GAD 7 Score 7 6  6   Anxiety Difficulty Somewhat difficult Somewhat difficult Somewhat difficult Somewhat difficult   Relevant past medical, surgical, family and social history reviewed and updated as indicated. Interim medical history since our last visit reviewed. Allergies and medications reviewed and updated.  Review of Systems  Constitutional:  Positive for fatigue. Negative for activity change, diaphoresis and fever.  Respiratory:  Negative for cough, chest tightness, shortness of breath and wheezing.   Cardiovascular:  Negative for chest pain, palpitations and leg swelling.  Gastrointestinal: Negative.   Endocrine: Negative.   Neurological: Negative.   Psychiatric/Behavioral:  Positive for sleep disturbance. Negative for decreased concentration, hallucinations, self-injury and suicidal ideas. The patient is nervous/anxious. The patient is not hyperactive.  Per HPI unless specifically indicated above     Objective:    BP 127/81   Pulse 77   Temp 97.9 F (36.6 C) (Oral)   Ht 5' 10.08" (1.78 m)   Wt 236 lb 6.4 oz (107.2 kg)   SpO2 97%   BMI 33.84 kg/m   Wt Readings from Last 3 Encounters:  03/10/23 236 lb 6.4 oz (107.2 kg)  12/31/22 238 lb 3.2 oz (108 kg)  10/18/22 242 lb 8.1 oz (110 kg)    Physical Exam Vitals and nursing note reviewed.  Constitutional:      General: He is awake. He is not in acute distress.    Appearance: He is well-developed and well-groomed. He is obese. He is not ill-appearing or toxic-appearing.  HENT:     Head: Normocephalic.     Right Ear: Hearing and external ear normal.     Left Ear: Hearing and external ear normal.  Eyes:     General: Lids are  normal.     Extraocular Movements: Extraocular movements intact.     Conjunctiva/sclera: Conjunctivae normal.  Neck:     Thyroid: No thyromegaly.     Vascular: No carotid bruit.  Cardiovascular:     Rate and Rhythm: Normal rate and regular rhythm.     Heart sounds: Normal heart sounds. No murmur heard.    No gallop.  Pulmonary:     Effort: No accessory muscle usage or respiratory distress.     Breath sounds: Normal breath sounds.  Abdominal:     General: Bowel sounds are normal. There is no distension.     Palpations: Abdomen is soft.     Tenderness: There is no abdominal tenderness.  Musculoskeletal:     Cervical back: Full passive range of motion without pain.     Right lower leg: No edema.     Left lower leg: No edema.  Lymphadenopathy:     Cervical: No cervical adenopathy.  Skin:    General: Skin is warm.     Capillary Refill: Capillary refill takes less than 2 seconds.  Neurological:     Mental Status: He is alert and oriented to person, place, and time.     Deep Tendon Reflexes: Reflexes are normal and symmetric.     Reflex Scores:      Brachioradialis reflexes are 2+ on the right side and 2+ on the left side.      Patellar reflexes are 2+ on the right side and 2+ on the left side. Psychiatric:        Attention and Perception: Attention normal.        Mood and Affect: Mood normal.        Speech: Speech normal.        Behavior: Behavior normal. Behavior is cooperative.        Thought Content: Thought content normal.    Results for orders placed or performed in visit on 01/30/23  Testosterone, free, total([facility])  Result Value Ref Range   Testosterone 280 264 - 916 ng/dL   Testosterone, Free 47.8 (H) 6.6 - 18.1 pg/mL   Sex Hormone Binding 26.7 19.3 - 76.4 nmol/L      Assessment & Plan:   Problem List Items Addressed This Visit       Other   Depression, recurrent (HCC) - Primary    Chronic, ongoing.  Denies SI/HI.  Some anhedonia and fatigue, loss  of motivation.  Will continue Prozac at 40 MG and Wellbutrin XL 150 MG at  this time.  Suspect some of his symptoms related to low testosterone, he is scheduled to see urology.  Discussed at length with patient medication and side effects.  Aware of BLACK BOX warning and to report immediately if SI presents.  Return in 8 weeks.      Relevant Medications   buPROPion (WELLBUTRIN XL) 150 MG 24 hr tablet   Low testosterone level in male    Ongoing, not x 2 on labs <300.  Is scheduled to see urology upcoming, will review notes after his visit with them.  Reiterated importance of attending this, as suspect some of his symptoms related to low levels.        Follow up plan: Return in about 9 weeks (around 05/12/2023) for FATIGUE -- can cancel July 29th visit.

## 2023-03-10 NOTE — Assessment & Plan Note (Signed)
Chronic, ongoing.  Denies SI/HI.  Some anhedonia and fatigue, loss of motivation.  Will continue Prozac at 40 MG and Wellbutrin XL 150 MG at this time.  Suspect some of his symptoms related to low testosterone, he is scheduled to see urology.  Discussed at length with patient medication and side effects.  Aware of BLACK BOX warning and to report immediately if SI presents.  Return in 8 weeks.

## 2023-03-16 ENCOUNTER — Ambulatory Visit: Payer: 59 | Admitting: Nurse Practitioner

## 2023-03-23 ENCOUNTER — Ambulatory Visit: Payer: 59 | Admitting: Urology

## 2023-03-23 ENCOUNTER — Encounter: Payer: Self-pay | Admitting: Urology

## 2023-03-23 VITALS — BP 121/77 | HR 89 | Wt 235.5 lb

## 2023-03-23 DIAGNOSIS — R7989 Other specified abnormal findings of blood chemistry: Secondary | ICD-10-CM

## 2023-03-23 DIAGNOSIS — E291 Testicular hypofunction: Secondary | ICD-10-CM

## 2023-03-23 NOTE — Progress Notes (Signed)
I,Dina M Abdulla,acting as a scribe for Riki Altes, MD.,have documented all relevant documentation on the behalf of Riki Altes, MD,as directed by  Riki Altes, MD while in the presence of Riki Altes, MD.  03/23/2023 12:39 PM   [name] [birth year] [id number]  Referring provider: Marjie Skiff, NP [address] [place],  [place] [address]  Chief Complaint  Patient presents with   loww testosterone    HPI: Peter Bennett is a 62 y.o. male referred for evaluation of hypogonadism.  A recent office visit with PCP with complaints of exhaustion and fatigue. Positive depression and erectile dysfunction. A testosterone level checked 12/31/22 was low at 231, however, a free testosterone level was elevated at 25.2. Repeat testosterone level 01/30/23 was low-normal at 280. However, a free testosterone was again elevated at 21.5. No bothersome LUTS, no dysuria, or gross hematuria, and denies previous history of urologic problems. A sleep study has been ordered but not yet scheduled.   PMH: Past Medical History:  Diagnosis Date   Allergy    Depression    Hyperlipidemia    Hypertension    Osteoarthritis    Sleep apnea     Surgical History: Past Surgical History:  Procedure Laterality Date   Bicep reattachmet     disk repair     FOOT SURGERY Right    TONSILLECTOMY      Home Medications:  Allergies as of 03/23/2023   No Known Allergies      Medication List        Accurate as of March 23, 2023 12:39 PM. If you have any questions, ask your nurse or doctor.          aspirin 81 MG tablet Take 81 mg by mouth daily.   benazepril 20 MG tablet Commonly known as: LOTENSIN Take 1 tablet (20 mg total) by mouth daily.   buPROPion 150 MG 24 hr tablet Commonly known as: Wellbutrin XL Take 1 tablet (150 mg total) by mouth daily.   Cholecalciferol 25 MCG (1000 UT) capsule Take 1,000 Units by mouth daily.   Fish Oil 1000 MG Caps Take 1,000 mg by  mouth daily.   FLUoxetine 40 MG capsule Commonly known as: PROZAC Take 1 capsule (40 mg total) by mouth daily.   fluticasone 50 MCG/ACT nasal spray Commonly known as: FLONASE Place 1 spray into both nostrils daily as needed for allergies or rhinitis.   gabapentin 100 MG capsule Commonly known as: NEURONTIN Take 100 mg twice a day for one week, then increase to 200 mg(2 tablets) twice a day and continue   glucosamine-chondroitin 500-400 MG tablet Take 1 tablet by mouth 3 (three) times daily.   hydrochlorothiazide 12.5 MG tablet Commonly known as: HYDRODIURIL Take 1 tablet (12.5 mg total) by mouth daily.   multivitamin tablet Take 1 tablet by mouth daily.   ondansetron 4 MG disintegrating tablet Commonly known as: ZOFRAN-ODT Take 1 tablet (4 mg total) by mouth every 8 (eight) hours as needed for nausea or vomiting.   oxyCODONE-acetaminophen 5-325 MG tablet Commonly known as: Percocet Take 1 tablet by mouth every 4 (four) hours as needed for severe pain.   rosuvastatin 20 MG tablet Commonly known as: Crestor Take 1 tablet (20 mg total) by mouth daily.        Family History: Family History  Problem Relation Age of Onset   Hypertension Mother    Heart disease Father     Social History:  reports  that he has never smoked. His smokeless tobacco use includes chew. He reports that he does not drink alcohol and does not use drugs.   Physical Exam: BP 121/77   Pulse 89   Wt 235 lb 8 oz (106.8 kg)   BMI 33.71 kg/m   Constitutional:  Alert and oriented, No acute distress. HEENT: [place] AT. GU: Phallus without lesions, testes descended bilaterally with estimated volume 20 cc bilaterally. Skin: No rashes, bruises or suspicious lesions. Neurologic: Grossly intact, no focal deficits, moving all 4 extremities. Psychiatric: Normal mood and affect.   Assessment & Plan:    1. Hypogonadism His free testosterone levels were elevated. We discussed this is the active form of  testosterone and his symptoms may not be related to low T. Nevertheless, AUA guidelines do no recommend checking free testosterone levels routinely. An AM repeat testosterone level was ordered as well as an LH. We discussed the diagnosis of testosterone is based on 2 abnormal total testosterone levels drawn in the a.m. admitted with signs and symptoms of low testosterone. We discussed various forms of testosterone placement including topical preparations, intramuscular injections, subcutaneous injections, subcutaneous pellet implantation and oral testosterone.  Pros and cons of each form were discussed.  The risk of transference of topical testosterone. I had an extensive discussion regarding testosterone replacement therapy including the following: Treatment may result in improvements in erectile function, low sex drive, anemia, bone mineral density, lean body mass, and depressive symptoms; evidence is inconclusive whether testosterone therapy improves cognitive function, measures of diabetes, energy, fatigue, lipid profiles, and quality of life measures; there is no conclusive evidence linking testosterone therapy to the development of prostate cancer; there is no definitive evidence linking testosterone therapy to a higher incidence of venothrombolic events; at the present time it cannot be stated definitively whether testosterone therapy increases or decreases the risk of cardiovascular events including myocardial infarction and stroke. Potential side effects were discussed including erythrocytosis, gynecomastia.  The need for regular monitoring of testosterone levels and hematocrit was discussed. We discussed that testosterone replacement can worsen sleep apnea and if his fatigue does not improve or resolve or worsens if he starts TRT, then he would definitely need to pursue the sleep study. He will be notified with his lab results and if starts replacement, he would desire a topical gel.  I have  reviewed the above documentation for accuracy and completeness, and I agree with the above.   Riki Altes, MD  [facility] [address] [place], [place] [address] [phone]

## 2023-03-24 ENCOUNTER — Other Ambulatory Visit: Payer: 59

## 2023-03-24 ENCOUNTER — Other Ambulatory Visit
Admission: RE | Admit: 2023-03-24 | Discharge: 2023-03-24 | Disposition: A | Discharge status: Home / Self Care | Payer: 59 | Attending: Urology | Admitting: Urology

## 2023-03-24 DIAGNOSIS — R7989 Other specified abnormal findings of blood chemistry: Secondary | ICD-10-CM | POA: Insufficient documentation

## 2023-03-27 ENCOUNTER — Other Ambulatory Visit: Payer: Self-pay | Admitting: Urology

## 2023-03-27 MED ORDER — TESTOSTERONE 20.25 MG/ACT (1.62%) TD GEL: TRANSDERMAL | 1 refills | Status: DC

## 2023-05-15 ENCOUNTER — Ambulatory Visit: Payer: Self-pay | Admitting: Nurse Practitioner

## 2023-05-21 ENCOUNTER — Ambulatory Visit: Payer: Self-pay | Admitting: Nurse Practitioner

## 2023-05-21 DIAGNOSIS — F339 Major depressive disorder, recurrent, unspecified: Secondary | ICD-10-CM

## 2023-05-21 DIAGNOSIS — R7989 Other specified abnormal findings of blood chemistry: Secondary | ICD-10-CM

## 2023-05-23 NOTE — Patient Instructions (Signed)
Testosterone Replacement Therapy Testosterone replacement therapy (TRT) treats men who have a low testosterone level. Testosterone is a male hormone that is produced in the testicles. It is responsible for typical male characteristics and for maintaining a man's sex drive and ability to get an erection. Testosterone also supports bone and muscle health. Low testosterone may not need to be treated. Your health care provider may recommend TRT if you have symptoms such as a low sex drive or erection problems, weak muscles or bones, or low energy. Types of TRT  You and your health care provider will decide which form is best for you. TRT is available in the following forms: Topical gels, creams, lotions, or sprays. Do not let other people, especially women or children, come in contact with the skin where the testosterone was applied. Nasal gels. Patches. Pills. Injections into the muscle or under the skin. Long-acting pellets inserted under the skin. The amount of TRT you take and how long you take it is based on your condition. It is important to: Begin TRT with the lowest possible dosage. Stop TRT if your health care provider tells you to stop. Work with your health care provider so that you feel informed and comfortable with your decision. Tell a health care provider about: Any allergies you have. Any personal or family history of blood clots, breast cancer, or prostate cancer. If you have sleep apnea or have been told that you snore. All medicines you are taking, including vitamins, herbs, eye drops, creams, and over-the-counter medicines. Any surgeries you have had. Any other medical conditions you have. If you wish to have biological children someday. What are the benefits? Benefits of TRT vary but may include improved sexual function, muscle mass or strength, mood, or quality of life. What are the risks? Risks of TRT vary depending on your individual and medical history. Side effects  can be related to the type of TRT you choose. If you choose products that are used on the skin, you may have skin irritation. If you get injections, you may have redness and swelling at the injection site. Side effects that can happen with any form of TRT include: Lower sperm count. Acne. Swelling of your legs or feet, or tenderness in the chest or breast area. Sleep disturbances and mood swings. Enlarged prostate. Increase in your red blood count. It is unclear if testosterone increases the risk of some serious conditions. Talk with your health care provider about your risk for these conditions, including: Blood clots. Heart disease, such as stroke or heart attack. Prostate cancer. Risks of TRT may increase if you: Have had or are at risk for prostate cancer or breast cancer. Have had a previous stroke or heart attack. Have a high number of red blood cells. Have treated or untreated sleep apnea. Have a large prostate. The long-term safety of TRT is not known. Follow these instructions at home: Take over-the-counter and prescription medicines only as told by your health care provider. Lose weight if you are overweight. Ask your health care provider to help you start a healthy diet and exercise program to reach and maintain a healthy weight. Work with your health care provider to manage other medical conditions that may lower your testosterone. These include obesity, high blood pressure, high cholesterol, diabetes, liver disease, kidney disease, and sleep apnea. Do not use any testosterone replacement therapies that are not prescribed by your health care provider. Keep all follow-up visits. This is important. Where to find more information American [employer]  Foundation: www.urologyhealth.org Endocrine Society: www.hormone.org Contact a health care provider if: You have side effects from your TRT. You have pain or swelling in your legs. You have problems urinating. You have lumps or  changes in your breasts or armpits. Get help right away if: You have shortness of breath. You have chest pain. You have slurred speech. You have weakness or numbness in any part of your arms or legs. These symptoms may represent a serious problem that is an emergency. Do not wait to see if the symptoms will go away. Get medical help right away. Call your local emergency services (911 in the [place] Do not drive yourself to the hospital. Summary Testosterone replacement therapy (TRT) is used to treat men who have a low testosterone level. TRT should only be prescribed by and under the supervision of a health care provider. Tell a health care provider about any medical conditions you have. TRT may have side effects. Talk with your health care provider about all of the risks and benefits before you start therapy. This information is not intended to replace advice given to you by your health care provider. Make sure you discuss any questions you have with your health care provider. Document Revised: 05/29/2020 Document Reviewed: 05/29/2020 Elsevier Patient Education  2024 [employer].

## 2023-05-25 ENCOUNTER — Encounter: Payer: Self-pay | Admitting: Nurse Practitioner

## 2023-05-25 ENCOUNTER — Ambulatory Visit: Payer: Self-pay | Admitting: Nurse Practitioner

## 2023-05-25 VITALS — BP 123/73 | HR 75 | Temp 98.0°F | Ht 70.0 in | Wt 238.4 lb

## 2023-05-25 DIAGNOSIS — E6609 Other obesity due to excess calories: Secondary | ICD-10-CM

## 2023-05-25 DIAGNOSIS — F339 Major depressive disorder, recurrent, unspecified: Secondary | ICD-10-CM

## 2023-05-25 DIAGNOSIS — Z6834 Body mass index (BMI) 34.0-34.9, adult: Secondary | ICD-10-CM

## 2023-05-25 DIAGNOSIS — G4733 Obstructive sleep apnea (adult) (pediatric): Secondary | ICD-10-CM

## 2023-05-25 DIAGNOSIS — E66811 Obesity, class 1: Secondary | ICD-10-CM

## 2023-05-25 DIAGNOSIS — R7989 Other specified abnormal findings of blood chemistry: Secondary | ICD-10-CM

## 2023-05-25 MED ORDER — BUPROPION HCL ER (XL) 300 MG PO TB24: 300.0000 mg | ORAL_TABLET | Freq: Every day | ORAL | 4 refills | Status: DC

## 2023-05-25 NOTE — Assessment & Plan Note (Signed)
Followed by urology and receiving treatment. Recent notes reviewed. Continue this collaboration.

## 2023-05-25 NOTE — Assessment & Plan Note (Signed)
Chronic, ongoing.  Denies SI/HI.  Some anhedonia and loss of motivation, ?if some related to untreated OSA.  Will continue Prozac at 40 MG and increase Wellbutrin XL to 300 MG at this time.  Suspect some of his symptoms related to low testosterone and OSA that is not treated, refer to these plans for further.  Discussed at length with patient medication and side effects.  Aware of BLACK BOX warning and to report immediately if SI presents.  Return in 6 weeks.

## 2023-05-25 NOTE — Progress Notes (Signed)
BP 123/73   Pulse 75   Temp 98 F (36.7 C) (Oral)   Ht 5\' 10"  (1.778 m)   Wt 238 lb 6.4 oz (108.1 kg)   SpO2 97%   BMI 34.21 kg/m    Subjective:    Patient ID: Peter Bennett, male    DOB: Apr 12, 1961, 62 y.o.   MRN: [id number]  HPI: Peter Bennett is a 62 y.o. male  Chief Complaint  Patient presents with   Fatigue    Patient states he his still feeling some fatigue. States he has a hard time waking up and getting motivated.    DEPRESSION Started Wellbutrin XL 150 MG last visit, no ADR with this.  Continues Prozac 40 MG daily. Occasionally will miss doses of medication, very rarely.  Ordered sleep study in May, has not attended this -- does not wish to attend this time.  Is being followed by urology for low testosterone -- started testosterone replacement on 03/23/23. Mood status: uncontrolled Satisfied with current treatment?: yes Symptom severity: moderate  Duration of current treatment : chronic Side effects: no Medication compliance: good compliance Psychotherapy/counseling: yes in the past Depressed mood: yes Anxious mood: occasional Anhedonia: no Significant weight loss or gain: no Insomnia: no trouble sleeping, sleeps too much Fatigue: yes Feelings of worthlessness or guilt: no Impaired concentration/indecisiveness: no Suicidal ideations: no Hopelessness: no Crying spells: no    03/10/2023    9:39 AM 12/31/2022    9:04 AM 09/09/2022    3:14 PM 08/07/2022    2:10 PM 01/21/2022    2:12 PM  Depression screen PHQ 2/9  Decreased Interest 2 1 1 1 1   Down, Depressed, Hopeless 1 1 1 1 1   PHQ - 2 Score 3 2 2 2 2   Altered sleeping 3 1 2 2 2   Tired, decreased energy 3 2 2 2 2   Change in appetite 1 1 1 1  0  Feeling bad or failure about yourself  1 1 1 1  0  Trouble concentrating 1 1 1 1  0  Moving slowly or fidgety/restless 1 1 0 1 0  Suicidal thoughts 0 0 0 0 0  PHQ-9 Score 13 9 9 10 6   Difficult doing work/chores Somewhat difficult Somewhat difficult   Somewhat difficult Somewhat difficult       03/10/2023    9:40 AM 12/31/2022    9:04 AM 08/07/2022    2:10 PM 01/21/2022    2:12 PM  GAD 7 : Generalized Anxiety Score  Nervous, Anxious, on Edge 1 1 1 1   Control/stop worrying 1 0 1 1  Worry too much - different things 1 1 1 1   Trouble relaxing 1 1 1 1   Restless 1 1 1  0  Easily annoyed or irritable 1 1 1 1   Afraid - awful might happen 1 1  1   Total GAD 7 Score 7 6  6   Anxiety Difficulty Somewhat difficult Somewhat difficult Somewhat difficult Somewhat difficult     Relevant past medical, surgical, family and social history reviewed and updated as indicated. Interim medical history since our last visit reviewed. Allergies and medications reviewed and updated.  Review of Systems  Constitutional:  Positive for fatigue. Negative for activity change, diaphoresis and fever.  Respiratory:  Negative for cough, chest tightness, shortness of breath and wheezing.   Cardiovascular:  Negative for chest pain, palpitations and leg swelling.  Gastrointestinal: Negative.   Endocrine: Negative.   Neurological: Negative.   Psychiatric/Behavioral:  Positive for sleep disturbance. Negative for  decreased concentration, hallucinations, self-injury and suicidal ideas. The patient is nervous/anxious. The patient is not hyperactive.     Per HPI unless specifically indicated above     Objective:    BP 123/73   Pulse 75   Temp 98 F (36.7 C) (Oral)   Ht 5\' 10"  (1.778 m)   Wt 238 lb 6.4 oz (108.1 kg)   SpO2 97%   BMI 34.21 kg/m   Wt Readings from Last 3 Encounters:  05/25/23 238 lb 6.4 oz (108.1 kg)  03/23/23 235 lb 8 oz (106.8 kg)  03/10/23 236 lb 6.4 oz (107.2 kg)    Physical Exam Vitals and nursing note reviewed.  Constitutional:      General: He is awake. He is not in acute distress.    Appearance: He is well-developed and well-groomed. He is obese. He is not ill-appearing or toxic-appearing.  HENT:     Head: Normocephalic.     Right Ear:  Hearing and external ear normal.     Left Ear: Hearing and external ear normal.  Eyes:     General: Lids are normal.     Extraocular Movements: Extraocular movements intact.     Conjunctiva/sclera: Conjunctivae normal.  Neck:     Thyroid: No thyromegaly.     Vascular: No carotid bruit.  Cardiovascular:     Rate and Rhythm: Normal rate and regular rhythm.     Heart sounds: Normal heart sounds. No murmur heard.    No gallop.  Pulmonary:     Effort: No accessory muscle usage or respiratory distress.     Breath sounds: Normal breath sounds.  Abdominal:     General: Bowel sounds are normal. There is no distension.     Palpations: Abdomen is soft.     Tenderness: There is no abdominal tenderness.  Musculoskeletal:     Cervical back: Full passive range of motion without pain.     Right lower leg: No edema.     Left lower leg: No edema.  Lymphadenopathy:     Cervical: No cervical adenopathy.  Skin:    General: Skin is warm.     Capillary Refill: Capillary refill takes less than 2 seconds.  Neurological:     Mental Status: He is alert and oriented to person, place, and time.     Deep Tendon Reflexes: Reflexes are normal and symmetric.     Reflex Scores:      Brachioradialis reflexes are 2+ on the right side and 2+ on the left side.      Patellar reflexes are 2+ on the right side and 2+ on the left side. Psychiatric:        Attention and Perception: Attention normal.        Mood and Affect: Mood normal.        Speech: Speech normal.        Behavior: Behavior normal. Behavior is cooperative.        Thought Content: Thought content normal.     Results for orders placed or performed during the hospital encounter of 03/24/23  Testosterone  Result Value Ref Range   Testosterone 273 264 - 916 ng/dL  Luteinizing hormone  Result Value Ref Range   LH 6.8 1.7 - 8.6 mIU/mL      Assessment & Plan:   Problem List Items Addressed This Visit       Respiratory   Sleep apnea - Primary     OSA on past testing, has not used CPAP in years.  ?  cause of increased daytime fatigue, highly suspect it is. Discussed at length with patient.  Will send for repeat sleep study, he prefers home study.  Placed a referral to [facility] for home study.      Relevant Orders   Ambulatory referral to Sleep Studies     Other   Depression, recurrent (HCC)    Chronic, ongoing.  Denies SI/HI.  Some anhedonia and loss of motivation, ?if some related to untreated OSA.  Will continue Prozac at 40 MG and increase Wellbutrin XL to 300 MG at this time.  Suspect some of his symptoms related to low testosterone and OSA that is not treated, refer to these plans for further.  Discussed at length with patient medication and side effects.  Aware of BLACK BOX warning and to report immediately if SI presents.  Return in 6 weeks.      Relevant Medications   buPROPion (WELLBUTRIN XL) 300 MG 24 hr tablet   Low testosterone level in male    Followed by urology and receiving treatment. Recent notes reviewed. Continue this collaboration.      Obesity    BMI 34.21.  Recommended eating smaller high protein, low fat meals more frequently and exercising 30 mins a day 5 times a week with a goal of 10-15lb weight loss in the next 3 months. Patient voiced their understanding and motivation to adhere to these recommendations.         Follow up plan: Return in about 6 weeks (around 07/06/2023) for MOOD & FATIGUE -- increase Wellbutrin to 300 and ordered sleep study.

## 2023-05-25 NOTE — Assessment & Plan Note (Signed)
OSA on past testing, has not used CPAP in years.  ?cause of increased daytime fatigue, highly suspect it is. Discussed at length with patient.  Will send for repeat sleep study, he prefers home study.  Placed a referral to [facility] for home study.

## 2023-05-25 NOTE — Assessment & Plan Note (Signed)
BMI 34.21.  Recommended eating smaller high protein, low fat meals more frequently and exercising 30 mins a day 5 times a week with a goal of 10-15lb weight loss in the next 3 months. Patient voiced their understanding and motivation to adhere to these recommendations.

## 2023-07-06 ENCOUNTER — Ambulatory Visit: Payer: Self-pay | Admitting: Nurse Practitioner

## 2023-07-10 ENCOUNTER — Encounter: Payer: Self-pay | Admitting: Nurse Practitioner

## 2023-07-13 ENCOUNTER — Encounter: Payer: Self-pay | Admitting: Nurse Practitioner

## 2023-07-18 NOTE — Patient Instructions (Signed)
Living With Sleep Apnea Sleep apnea is a condition in which breathing pauses or becomes shallow during sleep. Sleep apnea is most commonly caused by a collapsed or blocked airway. People with sleep apnea usually snore loudly. They may have times when they gasp and stop breathing for 10 seconds or more during sleep. This may happen many times during the night. The breaks in breathing also interrupt the deep sleep that you need to feel rested. Even if you do not completely wake up from the gaps in breathing, your sleep may not be restful and you feel tired during the day. You may also have a headache in the morning and low energy during the day, and you may feel anxious or depressed. How can sleep apnea affect me? Sleep apnea increases your chances of extreme tiredness during the day (daytime fatigue). It can also increase your risk for health conditions, such as: Heart attack. Stroke. Obesity. Type 2 diabetes. Heart failure. Irregular heartbeat. High blood pressure. If you have daytime fatigue as a result of sleep apnea, you may be more likely to: Perform poorly at school or work. Fall asleep while driving. Have difficulty with attention. Develop depression or anxiety. Have sexual dysfunction. What actions can I take to manage sleep apnea? Sleep apnea treatment  If you were given a device to open your airway while you sleep, use it only as told by your health care provider. You may be given: An oral appliance. This is a custom-made mouthpiece that shifts your lower jaw forward. A continuous positive airway pressure (CPAP) device. This device blows air through a mask when you breathe out (exhale). A nasal expiratory positive airway pressure (EPAP) device. This device has valves that you put into each nostril. A bi-level positive airway pressure (BIPAP) device. This device blows air through a mask when you breathe in (inhale) and breathe out (exhale). You may need surgery if other treatments  do not work for you. Sleep habits Go to sleep and wake up at the same time every day. This helps set your internal clock (circadian rhythm) for sleeping. If you stay up later than usual, such as on weekends, try to get up in the morning within 2 hours of your normal wake time. Try to get at least 7-9 hours of sleep each night. Stop using a computer, tablet, and mobile phone a few hours before bedtime. Do not take long naps during the day. If you nap, limit it to 30 minutes. Have a relaxing bedtime routine. Reading or listening to music may relax you and help you sleep. Use your bedroom only for sleep. Keep your television and computer out of your bedroom. Keep your bedroom cool, dark, and quiet. Use a supportive mattress and pillows. Follow your health care provider's instructions for other changes to sleep habits. Nutrition Do not eat heavy meals in the evening. Do not have caffeine in the later part of the day. The effects of caffeine can last for more than 5 hours. Follow your health care provider's or dietitian's instructions for any diet changes. Lifestyle     Do not drink alcohol before bedtime. Alcohol can cause you to fall asleep at first, but then it can cause you to wake up in the middle of the night and have trouble getting back to sleep. Do not use any products that contain nicotine or tobacco. These products include cigarettes, chewing tobacco, and vaping devices, such as e-cigarettes. If you need help quitting, ask your health care provider. Medicines Take   over-the-counter and prescription medicines only as told by your health care provider. Do not use over-the-counter sleep medicine. You can become dependent on this medicine, and it can make sleep apnea worse. Do not use medicines, such as sedatives and narcotics, unless told by your health care provider. Activity Exercise on most days, but avoid exercising in the evening. Exercising near bedtime can interfere with  sleeping. If possible, spend time outside every day. Natural light helps regulate your circadian rhythm. General information Lose weight if you need to, and maintain a healthy weight. Keep all follow-up visits. This is important. If you are having surgery, make sure to tell your health care provider that you have sleep apnea. You may need to bring your device with you. Where to find more information Learn more about sleep apnea and daytime fatigue from: American Sleep Association: sleepassociation.org National Sleep Foundation: sleepfoundation.org National Heart, Lung, and Blood Institute: nhlbi.nih.gov Summary Sleep apnea is a condition in which breathing pauses or becomes shallow during sleep. Sleep apnea can cause daytime fatigue and other serious health conditions. You may need to wear a device while sleeping to help keep your airway open. If you are having surgery, make sure to tell your health care provider that you have sleep apnea. You may need to bring your device with you. Making changes to sleep habits, diet, lifestyle, and activity can help you manage sleep apnea. This information is not intended to replace advice given to you by your health care provider. Make sure you discuss any questions you have with your health care provider. Document Revised: 03/13/2021 Document Reviewed: 07/13/2020 Elsevier Patient Education  2023 Elsevier Inc.  

## 2023-07-22 ENCOUNTER — Ambulatory Visit: Payer: 59 | Admitting: Nurse Practitioner

## 2023-07-22 ENCOUNTER — Encounter: Payer: Self-pay | Admitting: Nurse Practitioner

## 2023-07-22 VITALS — BP 119/73 | HR 89 | Temp 98.1°F | Ht 70.0 in | Wt 237.4 lb

## 2023-07-22 DIAGNOSIS — G4733 Obstructive sleep apnea (adult) (pediatric): Secondary | ICD-10-CM

## 2023-07-22 DIAGNOSIS — F339 Major depressive disorder, recurrent, unspecified: Secondary | ICD-10-CM

## 2023-07-22 MED ORDER — TIZANIDINE HCL 4 MG PO TABS: 4.0000 mg | ORAL_TABLET | Freq: Four times a day (QID) | ORAL | 1 refills | Status: DC | PRN

## 2023-07-22 MED ORDER — BUPROPION HCL ER (XL) 300 MG PO TB24: 300.0000 mg | ORAL_TABLET | Freq: Every day | ORAL | 4 refills | Status: DC

## 2023-07-22 NOTE — Assessment & Plan Note (Signed)
Chronic, ongoing.  Denies SI/HI.  Scores improving with Wellbutrin on board.  Will continue Prozac at 40 MG and Wellbutrin XL 300 MG at this time.  Suspect some of his symptoms related to OSA that is not treated, which we are working on.  Discussed at length with patient medication and side effects.  Aware of BLACK BOX warning and to report immediately if SI presents.

## 2023-07-22 NOTE — Progress Notes (Signed)
BP 119/73   Pulse 89   Temp 98.1 F (36.7 C) (Oral)   Ht 5\' 10"  (1.778 m)   Wt 237 lb 6.4 oz (107.7 kg)   SpO2 98%   BMI 34.06 kg/m    Subjective:    Patient ID: Peter Bennett, male    DOB: [birth year], 62 y.o.   MRN: [id number]  HPI: Peter Bennett is a 62 y.o. male  Chief Complaint  Patient presents with   Fatigue   Sleep Apnea   SLEEP APNEA Recently repeated sleep study due to ongoing fatigue and history of OSA with CPAP.  He reports the CPAP did cause issues with mask as is side sleeper and it would always come off -- this was back in 80 to 90's.  Is going to go to [facility] and look at masks and equipment.  Reports Wellbutrin and Prozac are benefiting mood. Would like refill on muscle relaxer for back. Sleep apnea status: uncontrolled Duration: months CPAP use:  not present Last sleep study: November 2024 Treatments attempted: to get CPAP Wakes feeling refreshed:  no Daytime hypersomnolence:  yes Fatigue:  yes Insomnia:  yes Good sleep hygiene:  yes Difficulty falling asleep:  no Difficulty staying asleep:  yes Snoring bothers bed partner:  no Observed apnea by bed partner: no Obesity:  yes Hypertension: yes  Pulmonary hypertension:  no Coronary artery disease:  no     07/22/2023    4:16 PM 03/10/2023    9:39 AM 12/31/2022    9:04 AM 09/09/2022    3:14 PM 08/07/2022    2:10 PM  Depression screen PHQ 2/9  Decreased Interest 1 2 1 1 1   Down, Depressed, Hopeless 1 1 1 1 1   PHQ - 2 Score 2 3 2 2 2   Altered sleeping 1 3 1 2 2   Tired, decreased energy 2 3 2 2 2   Change in appetite 1 1 1 1 1   Feeling bad or failure about yourself  1 1 1 1 1   Trouble concentrating 0 1 1 1 1   Moving slowly or fidgety/restless 0 1 1 0 1  Suicidal thoughts 0 0 0 0 0  PHQ-9 Score 7 13 9 9 10   Difficult doing work/chores Somewhat difficult Somewhat difficult Somewhat difficult  Somewhat difficult       07/22/2023    4:16 PM 03/10/2023    9:40 AM 12/31/2022    9:04 AM  08/07/2022    2:10 PM  GAD 7 : Generalized Anxiety Score  Nervous, Anxious, on Edge 1 1 1 1   Control/stop worrying 0 1 0 1  Worry too much - different things 1 1 1 1   Trouble relaxing 1 1 1 1   Restless 0 1 1 1   Easily annoyed or irritable 1 1 1 1   Afraid - awful might happen 1 1 1    Total GAD 7 Score 5 7 6    Anxiety Difficulty Somewhat difficult Somewhat difficult Somewhat difficult Somewhat difficult      Relevant past medical, surgical, family and social history reviewed and updated as indicated. Interim medical history since our last visit reviewed. Allergies and medications reviewed and updated.  Review of Systems  Constitutional:  Positive for fatigue. Negative for activity change, diaphoresis and fever.  Respiratory:  Negative for cough, chest tightness, shortness of breath and wheezing.   Cardiovascular:  Negative for chest pain, palpitations and leg swelling.  Gastrointestinal: Negative.   Endocrine: Negative.   Neurological: Negative.   Psychiatric/Behavioral:  Positive  for sleep disturbance. Negative for decreased concentration, hallucinations, self-injury and suicidal ideas. The patient is nervous/anxious. The patient is not hyperactive.     Per HPI unless specifically indicated above     Objective:    BP 119/73   Pulse 89   Temp 98.1 F (36.7 C) (Oral)   Ht 5\' 10"  (1.778 m)   Wt 237 lb 6.4 oz (107.7 kg)   SpO2 98%   BMI 34.06 kg/m   Wt Readings from Last 3 Encounters:  07/22/23 237 lb 6.4 oz (107.7 kg)  05/25/23 238 lb 6.4 oz (108.1 kg)  03/23/23 235 lb 8 oz (106.8 kg)    Physical Exam Vitals and nursing note reviewed.  Constitutional:      General: He is awake. He is not in acute distress.    Appearance: He is well-developed and well-groomed. He is obese. He is not ill-appearing or toxic-appearing.  HENT:     Head: Normocephalic.     Right Ear: Hearing and external ear normal.     Left Ear: Hearing and external ear normal.  Eyes:     General: Lids  are normal.     Extraocular Movements: Extraocular movements intact.     Conjunctiva/sclera: Conjunctivae normal.  Neck:     Thyroid: No thyromegaly.     Vascular: No carotid bruit.  Cardiovascular:     Rate and Rhythm: Normal rate and regular rhythm.     Heart sounds: Normal heart sounds. No murmur heard.    No gallop.  Pulmonary:     Effort: No accessory muscle usage or respiratory distress.     Breath sounds: Normal breath sounds.  Abdominal:     General: Bowel sounds are normal. There is no distension.     Palpations: Abdomen is soft.     Tenderness: There is no abdominal tenderness.  Musculoskeletal:     Cervical back: Full passive range of motion without pain.     Right lower leg: No edema.     Left lower leg: No edema.  Lymphadenopathy:     Cervical: No cervical adenopathy.  Skin:    General: Skin is warm.     Capillary Refill: Capillary refill takes less than 2 seconds.  Neurological:     Mental Status: He is alert and oriented to person, place, and time.     Deep Tendon Reflexes: Reflexes are normal and symmetric.     Reflex Scores:      Brachioradialis reflexes are 2+ on the right side and 2+ on the left side.      Patellar reflexes are 2+ on the right side and 2+ on the left side. Psychiatric:        Attention and Perception: Attention normal.        Mood and Affect: Mood normal.        Speech: Speech normal.        Behavior: Behavior normal. Behavior is cooperative.        Thought Content: Thought content normal.     Results for orders placed or performed during the hospital encounter of 03/24/23  Testosterone  Result Value Ref Range   Testosterone 273 264 - 916 ng/dL  Luteinizing hormone  Result Value Ref Range   LH 6.8 1.7 - 8.6 mIU/mL      Assessment & Plan:   Problem List Items Addressed This Visit       Respiratory   Severe obstructive sleep apnea    Diagnosed in November on sleep study,  have ordered equipment and he will be going to pick this  out.  Recommend he try a pillow mask as did not tolerate full mask in past. Will plan on repeat study in a few months to see if benefit from CPAP.  Consider Inspire in future if poor tolerance.        Other   Depression, recurrent (HCC) - Primary    Chronic, ongoing.  Denies SI/HI.  Scores improving with Wellbutrin on board.  Will continue Prozac at 40 MG and Wellbutrin XL 300 MG at this time.  Suspect some of his symptoms related to OSA that is not treated, which we are working on.  Discussed at length with patient medication and side effects.  Aware of BLACK BOX warning and to report immediately if SI presents.        Relevant Medications   buPROPion (WELLBUTRIN XL) 300 MG 24 hr tablet   FLUoxetine (PROZAC) 40 MG capsule     Follow up plan: Return in about 8 weeks (around 09/16/2023) for OSA.

## 2023-07-22 NOTE — Assessment & Plan Note (Signed)
Diagnosed in November on sleep study, have ordered equipment and he will be going to pick this out.  Recommend he try a pillow mask as did not tolerate full mask in past. Will plan on repeat study in a few months to see if benefit from CPAP.  Consider Inspire in future if poor tolerance.

## 2023-07-30 DIAGNOSIS — G4733 Obstructive sleep apnea (adult) (pediatric): Secondary | ICD-10-CM | POA: Diagnosis not present

## 2023-08-04 DIAGNOSIS — G4733 Obstructive sleep apnea (adult) (pediatric): Secondary | ICD-10-CM | POA: Diagnosis not present

## 2023-08-25 ENCOUNTER — Other Ambulatory Visit: Payer: Self-pay | Admitting: Nurse Practitioner

## 2023-08-25 NOTE — Telephone Encounter (Signed)
 Medication Refill -  Most Recent Primary Care Visit:  Provider: CANNADY, JOLENE T  Department: [facility]  Visit Type: OFFICE VISIT  Date: 07/22/2023  Medication: gabapentin  (NEURONTIN ) 300 MG capsule Pt is requesting a 90 day supply.   Has the patient contacted their pharmacy? Yes   Is this the correct pharmacy for this prescription? Yes If no, delete pharmacy and type the correct one.  This is the patient's preferred pharmacy:  [facility] - [place], [place] - [address] [place] [address] Phone: [phone] Fax: [phone]    Has the prescription been filled recently? No  Is the patient out of the medication? Yes  Has the patient been seen for an appointment in the last year OR does the patient have an upcoming appointment? Yes  Can we respond through MyChart? No  Agent: Please be advised that Rx refills may take up to 3 business days. We ask that you follow-up with your pharmacy.

## 2023-08-27 NOTE — Telephone Encounter (Signed)
 Requested medication (s) are due for refill today: Yes  Requested medication (s) are on the active medication list: Yes  Last refill:  05/25/23  Future visit scheduled: Yes  Notes to clinic:  Unable to refill per protocol, last refill by another provider.      Requested Prescriptions  Pending Prescriptions Disp Refills   gabapentin  (NEURONTIN ) 300 MG capsule 60 capsule     Sig: Take 1 capsule (300 mg total) by mouth 2 (two) times daily.     Neurology: Anticonvulsants - gabapentin  Passed - 08/27/2023  3:48 PM      Passed - Cr in normal range and within 360 days    Creatinine  Date Value Ref Range Status  11/08/2011 1.08 0.60 - 1.30 mg/dL Final   Creatinine, Ser  Date Value Ref Range Status  12/31/2022 1.00 0.76 - 1.27 mg/dL Final         Passed - Completed PHQ-2 or PHQ-9 in the last 360 days      Passed - Valid encounter within last 12 months    Recent Outpatient Visits           1 month ago Depression, recurrent (HCC)   [facility] [facility] [place], [place] T, NP   3 months ago Obstructive sleep apnea syndrome   [facility] McBaine, Alden T, NP   5 months ago Depression, recurrent (HCC)   [place] [facility] [facility] Sundance, Melanie T, NP   7 months ago Depression, recurrent (HCC)   [facility] [place], Melanie T, NP   11 months ago Depression, recurrent [facility])   [facility] Daphane Rosella, NP       Future Appointments             In 2 weeks Cannady, Jolene T, NP [facility]

## 2023-08-28 MED ORDER — GABAPENTIN 300 MG PO CAPS: 300.0000 mg | ORAL_CAPSULE | Freq: Two times a day (BID) | ORAL | 4 refills | Status: DC

## 2023-08-30 DIAGNOSIS — G4733 Obstructive sleep apnea (adult) (pediatric): Secondary | ICD-10-CM | POA: Diagnosis not present

## 2023-08-31 DIAGNOSIS — G4733 Obstructive sleep apnea (adult) (pediatric): Secondary | ICD-10-CM | POA: Diagnosis not present

## 2023-09-03 DIAGNOSIS — M955 Acquired deformity of pelvis: Secondary | ICD-10-CM | POA: Diagnosis not present

## 2023-09-03 DIAGNOSIS — M5416 Radiculopathy, lumbar region: Secondary | ICD-10-CM | POA: Diagnosis not present

## 2023-09-03 DIAGNOSIS — M9903 Segmental and somatic dysfunction of lumbar region: Secondary | ICD-10-CM | POA: Diagnosis not present

## 2023-09-03 DIAGNOSIS — M9905 Segmental and somatic dysfunction of pelvic region: Secondary | ICD-10-CM | POA: Diagnosis not present

## 2023-09-13 NOTE — Patient Instructions (Signed)
Living With Sleep Apnea Sleep apnea is a condition that affects your breathing while you're sleeping. Your tongue or the tissue in your throat may block the flow of air while you sleep. You may have shallow breathing or stop breathing for short periods of time. The breaks in breathing interrupt the deep sleep that you need to feel rested. Even if you don't wake up from the gaps in breathing, you may feel tired during the day. People with sleep apnea may snore loudly. You may have a headache in the morning and feel anxious or depressed. How can sleep apnea affect me? Sleep apnea increases your chances of being very tired during the day. This is called daytime fatigue. Sleep apnea can also increase your risk of: Heart attack. Stroke. Obesity. Type 2 diabetes. Heart failure. Irregular heartbeat. High blood pressure. If you are very tired during the day, you may be more likely to: Not do well in school or at work. Fall asleep while driving. Have trouble paying attention. Develop depression or anxiety. Have problems having sex. This is called sexual dysfunction. What actions can I take to manage sleep apnea? Sleep apnea treatment  If you were given a device to open your airway while you sleep, use it only as told by your health care provider. You may be given: An oral appliance. This is a mouthpiece that shifts your lower jaw forward. A continuous positive airway pressure (CPAP) device. This blows air through a mask. A nasal expiratory positive airway pressure (EPAP) device. This has valves that you put into each nostril. A bi-level positive airway pressure (BIPAP) device. This blows air through a mask when you breathe in and breathe out. You may need surgery if other treatments don't work for you. Sleep habits Go to sleep and wake up at the same time every day. This helps set your internal clock for sleeping. If you stay up later than usual on weekends, try to get up in the morning within 2  hours of the time you usually wake up. Try to get at least 7-9 hours of sleep each night. Stop using a computer, tablet, and mobile phone a few hours before bedtime. Do not take long naps during the day. If you nap, limit it to 30 minutes. Have a relaxing bedtime routine. Reading or listening to music may relax you and help you sleep. Use your bedroom only for sleep. Keep your television and computer out of your bedroom. Keep your bedroom cool, dark, and quiet. Use a supportive mattress and pillows. Follow your provider's instructions for other changes to sleep habits. Nutrition Do not eat big meals in the evening. Do not have caffeine in the later part of the day. The effects of caffeine can last for more than 5 hours. Follow your provider's instructions for any changes to what you eat and drink. Lifestyle Do not drink alcohol before bedtime. Alcohol can cause you to fall asleep at first, but then it can cause you to wake up in the middle of the night and have trouble getting back to sleep. Do not smoke, vape, or use nicotine or tobacco. Medicines Take over-the-counter and prescription medicines only as told by your provider. Do not use over-the-counter sleep medicine. You may become dependent on this medicine, and it can make sleep apnea worse. Do not take medicines, such as sedatives and narcotics, unless told to by your provider. Activity Exercise on most days, but avoid exercising in the evening. Exercising near bedtime can interfere with sleeping.  If possible, spend time outside every day. Natural light helps with your internal clock. General information Lose weight if you need to. Stay at a healthy weight. If you are having surgery, make sure to tell your provider that you have sleep apnea. You may need to bring your device with you. Keep all follow-up visits. Your provider will want to check on your condition. Where to find more information National Heart, Lung, and Blood  Institute: [employer] This information is not intended to replace advice given to you by your health care provider. Make sure you discuss any questions you have with your health care provider. Document Revised: 11/26/2022 Document Reviewed: 11/26/2022 Elsevier Patient Education  2024 [employer].

## 2023-09-16 ENCOUNTER — Ambulatory Visit: Payer: 59 | Admitting: Nurse Practitioner

## 2023-09-16 ENCOUNTER — Encounter: Payer: Self-pay | Admitting: Nurse Practitioner

## 2023-09-16 VITALS — BP 119/63 | HR 86 | Temp 98.1°F | Ht 70.0 in | Wt 245.8 lb

## 2023-09-16 DIAGNOSIS — G4733 Obstructive sleep apnea (adult) (pediatric): Secondary | ICD-10-CM

## 2023-09-16 DIAGNOSIS — F339 Major depressive disorder, recurrent, unspecified: Secondary | ICD-10-CM

## 2023-09-16 MED ORDER — ROSUVASTATIN CALCIUM 20 MG PO TABS: 20.0000 mg | ORAL_TABLET | Freq: Every day | ORAL | 4 refills | Status: AC

## 2023-09-16 NOTE — Assessment & Plan Note (Signed)
Ongoing and using CPAP 100% of the time since obtaining it.  Overall good compliance.

## 2023-09-16 NOTE — Progress Notes (Signed)
BP 119/63   Pulse 86   Temp 98.1 F (36.7 C) (Oral)   Ht 5\' 10"  (1.778 m)   Wt 245 lb 12.8 oz (111.5 kg)   SpO2 94%   BMI 35.27 kg/m    Subjective:    Patient ID: Peter Bennett, male    DOB: Apr 26, 1961, 63 y.o.   MRN: [id number]  HPI: Peter Bennett is a 63 y.o. male  Chief Complaint  Patient presents with   Depression   Sleep Apnea   SLEEP APNEA Diagnosed on 06/25/23 with severe. Has CPAP currently and is using this 100% of the time with 7-8 hours of sleep -- started over 31 days ago.  Needs compliance notes sent to [facility] -- phone [phone] or [phone]. Sleep apnea status: stable Duration: weeks Satisfied with current treatment?:  yes CPAP use:  yes Sleep quality with CPAP use: excellent Treament compliance:excellent compliance Last sleep study: 06/25/23 Treatments attempted: CPAP Wakes feeling refreshed:  yes Daytime hypersomnolence:  no Fatigue:   improving Insomnia:  no Good sleep hygiene:  yes Difficulty falling asleep:  no Difficulty staying asleep:  no Obesity:  yes Hypertension: yes  Pulmonary hypertension:  no Coronary artery disease:  no  DEPRESSION Taking Wellbutrin XL 300 MG daily and Prozac 40 MG daily.  Continues on Testosterone for low levels. Mood status: stable Satisfied with current treatment?: yes Symptom severity: moderate  Duration of current treatment : chronic Side effects: no Medication compliance: good compliance Psychotherapy/counseling: none Depressed mood: no Anxious mood: occasional Anhedonia: no Significant weight loss or gain: no Insomnia: none Fatigue: improving Feelings of worthlessness or guilt: no Impaired concentration/indecisiveness: no Suicidal ideations: no Hopelessness: no Crying spells: no    09/16/2023    2:25 PM 07/22/2023    4:16 PM 03/10/2023    9:39 AM 12/31/2022    9:04 AM 09/09/2022    3:14 PM  Depression screen PHQ 2/9  Decreased Interest 1 1 2 1 1   Down, Depressed, Hopeless 1 1 1 1 1    PHQ - 2 Score 2 2 3 2 2   Altered sleeping 1 1 3 1 2   Tired, decreased energy 1 2 3 2 2   Change in appetite 0 1 1 1 1   Feeling bad or failure about yourself  0 1 1 1 1   Trouble concentrating 0 0 1 1 1   Moving slowly or fidgety/restless 0 0 1 1 0  Suicidal thoughts 0 0 0 0 0  PHQ-9 Score 4 7 13 9 9   Difficult doing work/chores Somewhat difficult Somewhat difficult Somewhat difficult Somewhat difficult        09/16/2023    2:25 PM 07/22/2023    4:16 PM 03/10/2023    9:40 AM 12/31/2022    9:04 AM  GAD 7 : Generalized Anxiety Score  Nervous, Anxious, on Edge 0 1 1 1   Control/stop worrying 1 0 1 0  Worry too much - different things 2 1 1 1   Trouble relaxing 2 1 1 1   Restless 0 0 1 1  Easily annoyed or irritable 2 1 1 1   Afraid - awful might happen 2 1 1 1   Total GAD 7 Score 9 5 7 6   Anxiety Difficulty Somewhat difficult Somewhat difficult Somewhat difficult Somewhat difficult   Relevant past medical, surgical, family and social history reviewed and updated as indicated. Interim medical history since our last visit reviewed. Allergies and medications reviewed and updated.  Review of Systems  Constitutional:  Positive for fatigue (improving). Negative  for activity change, diaphoresis and fever.  Respiratory:  Negative for cough, chest tightness, shortness of breath and wheezing.   Cardiovascular:  Negative for chest pain, palpitations and leg swelling.  Gastrointestinal: Negative.   Endocrine: Negative.   Neurological: Negative.   Psychiatric/Behavioral:  Negative for decreased concentration, hallucinations, self-injury, sleep disturbance and suicidal ideas. The patient is nervous/anxious. The patient is not hyperactive.    Per HPI unless specifically indicated above     Objective:    BP 119/63   Pulse 86   Temp 98.1 F (36.7 C) (Oral)   Ht 5\' 10"  (1.778 m)   Wt 245 lb 12.8 oz (111.5 kg)   SpO2 94%   BMI 35.27 kg/m   Wt Readings from Last 3 Encounters:  09/16/23 245 lb  12.8 oz (111.5 kg)  07/22/23 237 lb 6.4 oz (107.7 kg)  05/25/23 238 lb 6.4 oz (108.1 kg)    Physical Exam Vitals and nursing note reviewed.  Constitutional:      General: He is awake. He is not in acute distress.    Appearance: He is well-developed and well-groomed. He is obese. He is not ill-appearing or toxic-appearing.  HENT:     Head: Normocephalic.     Right Ear: Hearing and external ear normal.     Left Ear: Hearing and external ear normal.  Eyes:     General: Lids are normal.     Extraocular Movements: Extraocular movements intact.     Conjunctiva/sclera: Conjunctivae normal.  Neck:     Thyroid: No thyromegaly.     Vascular: No carotid bruit.  Cardiovascular:     Rate and Rhythm: Normal rate and regular rhythm.     Heart sounds: Normal heart sounds. No murmur heard.    No gallop.  Pulmonary:     Effort: No accessory muscle usage or respiratory distress.     Breath sounds: Normal breath sounds.  Abdominal:     General: Bowel sounds are normal. There is no distension.     Palpations: Abdomen is soft.     Tenderness: There is no abdominal tenderness.  Musculoskeletal:     Cervical back: Full passive range of motion without pain.     Right lower leg: No edema.     Left lower leg: No edema.  Lymphadenopathy:     Cervical: No cervical adenopathy.  Skin:    General: Skin is warm.     Capillary Refill: Capillary refill takes less than 2 seconds.  Neurological:     Mental Status: He is alert and oriented to person, place, and time.     Deep Tendon Reflexes: Reflexes are normal and symmetric.     Reflex Scores:      Brachioradialis reflexes are 2+ on the right side and 2+ on the left side.      Patellar reflexes are 2+ on the right side and 2+ on the left side. Psychiatric:        Attention and Perception: Attention normal.        Mood and Affect: Mood normal.        Speech: Speech normal.        Behavior: Behavior normal. Behavior is cooperative.        Thought  Content: Thought content normal.     Results for orders placed or performed during the hospital encounter of 03/24/23  Testosterone   Collection Time: 03/24/23  9:31 AM  Result Value Ref Range   Testosterone 273 264 - 916 ng/dL  Luteinizing hormone   Collection Time: 03/24/23  9:31 AM  Result Value Ref Range   LH 6.8 1.7 - 8.6 mIU/mL      Assessment & Plan:   Problem List Items Addressed This Visit       Respiratory   Severe obstructive sleep apnea   Ongoing and using CPAP 100% of the time since obtaining it.  Overall good compliance.        Other   Depression, recurrent (HCC) - Primary   Chronic, ongoing.  Denies SI/HI.  Will continue Prozac at 40 MG and Wellbutrin XL 300 MG at this time.  Does have a mild tremor present to hands since starting Wellbutrin which discussed with him can be side effect, wishes to keep medicine on board at this time. Discussed at length with patient medication and side effects.  Aware of BLACK BOX warning and to report immediately if SI presents.          Follow up plan: Return in about 4 months (around 01/14/2024) for Annual Physical -- after 01/08/24.

## 2023-09-16 NOTE — Assessment & Plan Note (Signed)
Chronic, ongoing.  Denies SI/HI.  Will continue Prozac at 40 MG and Wellbutrin XL 300 MG at this time.  Does have a mild tremor present to hands since starting Wellbutrin which discussed with him can be side effect, wishes to keep medicine on board at this time. Discussed at length with patient medication and side effects.  Aware of BLACK BOX warning and to report immediately if SI presents.

## 2023-09-30 DIAGNOSIS — G4733 Obstructive sleep apnea (adult) (pediatric): Secondary | ICD-10-CM | POA: Diagnosis not present

## 2023-12-24 ENCOUNTER — Ambulatory Visit (INDEPENDENT_AMBULATORY_CARE_PROVIDER_SITE_OTHER): Payer: Self-pay | Admitting: Urology

## 2023-12-24 ENCOUNTER — Ambulatory Visit
Admission: RE | Admit: 2023-12-24 | Discharge: 2023-12-24 | Disposition: A | Discharge status: Home / Self Care | Payer: Self-pay | Source: Ambulatory Visit | Attending: Urology | Admitting: Urology

## 2023-12-24 VITALS — BP 115/71 | HR 90 | Ht 70.0 in | Wt 240.0 lb

## 2023-12-24 DIAGNOSIS — R109 Unspecified abdominal pain: Secondary | ICD-10-CM

## 2023-12-24 DIAGNOSIS — Z87442 Personal history of urinary calculi: Secondary | ICD-10-CM

## 2023-12-24 LAB — URINALYSIS, COMPLETE
Bilirubin, UA: NEGATIVE
Glucose, UA: NEGATIVE
Ketones, UA: NEGATIVE
Leukocytes,UA: NEGATIVE
Nitrite, UA: NEGATIVE
Protein,UA: NEGATIVE
Specific Gravity, UA: 1.02 (ref 1.005–1.030)
Urobilinogen, Ur: 0.2 mg/dL (ref 0.2–1.0)
pH, UA: 6 (ref 5.0–7.5)

## 2023-12-24 LAB — MICROSCOPIC EXAMINATION: Bacteria, UA: NONE SEEN

## 2023-12-24 NOTE — Progress Notes (Unsigned)
 I, Maysun Jamey Mccallum, acting as a scribe for Geraline Knapp, MD., have documented all relevant documentation on the behalf of Geraline Knapp, MD, as directed by Geraline Knapp, MD while in the presence of Geraline Knapp, MD.  12/24/2023 2:52 PM   Peter Bennett [birth year] [id number]  Referring provider: Lemar Pyles, NP [address] [place],  [place] [address]  Chief Complaint  Patient presents with   Flank Pain    HPI: Peter Bennett is a 63 y.o. male presents for evaluation of right flank pain.   2-3 week history of right flank pain. Pain is non-radiating and at its worst estimates at 6-7/10.  No bothersome lower urinary tract symptoms.  Nausea without vomiting; no fever or chills.  Also has lower abdominal discomfort.  Seen in the ED March 2024 with similar symptoms and CT showed a 2 mm right UVJ stone. Although he did not see a stone, his symptoms resolved until recently. His CT did not show additional renal calculi.   PMH: Past Medical History:  Diagnosis Date   Allergy    Depression    Hyperlipidemia    Hypertension    Osteoarthritis    Sleep apnea     Surgical History: Past Surgical History:  Procedure Laterality Date   Bicep reattachmet     disk repair     FOOT SURGERY Right    TONSILLECTOMY      Home Medications:  Allergies as of 12/24/2023   No Known Allergies      Medication List        Accurate as of Dec 24, 2023  2:52 PM. If you have any questions, ask your nurse or doctor.          STOP taking these medications    gabapentin  300 MG capsule Commonly known as: NEURONTIN        TAKE these medications    aspirin  81 MG tablet Take 81 mg by mouth daily.   benazepril  20 MG tablet Commonly known as: LOTENSIN  Take 1 tablet (20 mg total) by mouth daily.   buPROPion  300 MG 24 hr tablet Commonly known as: Wellbutrin  XL Take 1 tablet (300 mg total) by mouth daily.   Cholecalciferol 25 MCG (1000 UT) capsule Take 1,000 Units by  mouth daily.   Fish Oil 1000 MG Caps Take 1,000 mg by mouth daily.   FLUoxetine  40 MG capsule Commonly known as: PROZAC  Take 40 mg by mouth daily.   fluticasone  50 MCG/ACT nasal spray Commonly known as: FLONASE  Place 1 spray into both nostrils daily as needed for allergies or rhinitis.   glucosamine-chondroitin 500-400 MG tablet Take 1 tablet by mouth 3 (three) times daily.   hydrochlorothiazide  12.5 MG tablet Commonly known as: HYDRODIURIL  Take 1 tablet (12.5 mg total) by mouth daily.   multivitamin tablet Take 1 tablet by mouth daily.   pregabalin 25 MG capsule Commonly known as: LYRICA Take by mouth 2 (two) times daily.   rosuvastatin  20 MG tablet Commonly known as: Crestor  Take 1 tablet (20 mg total) by mouth daily.   tiZANidine  4 MG tablet Commonly known as: Zanaflex  Take 1 tablet (4 mg total) by mouth every 6 (six) hours as needed for muscle spasms.        Allergies: No Known Allergies  Family History: Family History  Problem Relation Age of Onset   Hypertension Mother    Heart disease Father     Social History:  reports that he  has never smoked. His smokeless tobacco use includes chew. He reports that he does not drink alcohol and does not use drugs.   Physical Exam: BP 115/71   Pulse 90   Ht 5\' 10"  (1.778 m)   Wt 240 lb (108.9 kg)   BMI 34.44 kg/m   Constitutional:  Alert and oriented, No acute distress. HEENT: [place] AT Respiratory: Normal respiratory effort, no increased work of breathing. Psychiatric: Normal mood and affect.   Urinalysis Dipstick trace blood/microscopy negative.   Pertinent Imaging: CT was personally reviewed and interpreted.   CT EXAM: CT ABDOMEN AND PELVIS WITH CONTRAST   TECHNIQUE: Multidetector CT imaging of the abdomen and pelvis was performed using the standard protocol following bolus administration of intravenous contrast.   RADIATION DOSE REDUCTION: This exam was performed according to the departmental  dose-optimization program which includes automated exposure control, adjustment of the mA and/or kV according to patient size and/or use of iterative reconstruction technique.   CONTRAST:  OMNIPAQUE  IOHEXOL  350 MG/ML SOLN   COMPARISON:  None Available.   FINDINGS: Lower chest: No acute abnormality.   Hepatobiliary: No focal liver abnormality is seen. No gallstones, gallbladder wall thickening, or biliary dilatation.   Pancreas: Unremarkable. No pancreatic ductal dilatation or surrounding inflammatory changes.   Spleen: Normal in size without focal abnormality.   Adrenals/Urinary Tract: Adrenal glands are within normal limits. Left kidney shows no renal calculi or obstructive changes. The right kidney demonstrates some slight delay in enhancement with minimal fullness of the collecting system and ureter noted. Fullness extends to the ureterovesical junction where a small 2 mm stone is noted at the UVJ. The bladder is decompressed. Perinephric stranding on the right is noted.   Stomach/Bowel: Colon is predominately decompressed. The appendix is within normal limits. Small bowel and stomach are unremarkable.   Vascular/Lymphatic: Aortic atherosclerosis. No enlarged abdominal or pelvic lymph nodes.   Reproductive: Prostate is unremarkable.   Other: No abdominal wall hernia or abnormality. No abdominopelvic ascites.   Musculoskeletal: No acute or significant osseous findings.   IMPRESSION: 2 mm right UVJ stone with mild obstructive changes.   No other focal abnormality is noted.     Electronically Signed   By: Violeta Grey M.D.   On: 10/18/2022 23:52   Assessment & Plan:    1. Right flank pain Unlikely this is the same stone from March 2024 KUB ordered and will call with results.  If a definite stone not visualized on KUB, we discussed the option of a stone protocol CT versus a trial of tamsulosin , pain medications, and CT if he does not pass a stone.  I  have reviewed the above documentation for accuracy and completeness, and I agree with the above.   Geraline Knapp, MD  [facility] [address], [address] [place], [place] [address] [phone]

## 2023-12-26 ENCOUNTER — Encounter: Payer: Self-pay | Admitting: Urology

## 2023-12-26 ENCOUNTER — Telehealth: Payer: Self-pay | Admitting: Urology

## 2023-12-26 NOTE — Telephone Encounter (Signed)
 KUB reviewed and a definite stone not seen.  Recommend further evaluation with renal stone CT.  Please contact patient and order CT.  Will contact with results

## 2023-12-28 ENCOUNTER — Other Ambulatory Visit: Payer: Self-pay | Admitting: *Deleted

## 2023-12-28 DIAGNOSIS — R109 Unspecified abdominal pain: Secondary | ICD-10-CM

## 2023-12-28 NOTE — Telephone Encounter (Signed)
Pt informed. Pt voiced understanding.

## 2024-01-08 ENCOUNTER — Ambulatory Visit
Admission: RE | Admit: 2024-01-08 | Discharge: 2024-01-08 | Disposition: A | Discharge status: Home / Self Care | Payer: Self-pay | Source: Ambulatory Visit | Attending: Urology | Admitting: Urology

## 2024-01-08 DIAGNOSIS — R109 Unspecified abdominal pain: Secondary | ICD-10-CM | POA: Insufficient documentation

## 2024-01-10 ENCOUNTER — Ambulatory Visit: Payer: Self-pay | Admitting: Urology

## 2024-01-16 NOTE — Patient Instructions (Incomplete)
 Melatonin 5 to 10 MG (supplement section)  [facility] for OSA mask: Address: [address] Geraldene Kleine [place], [place] [address] Phone: [phone]  Managing Depression, Adult Depression is a mental health condition that affects your thoughts, feelings, and actions. Being diagnosed with depression can bring you relief if you did not know why you have felt or behaved a certain way. It could also leave you feeling overwhelmed. Finding ways to manage your symptoms can help you feel more positive about your future. How to manage lifestyle changes Being depressed is difficult. Depression can increase the level of everyday stress. Stress can make depression symptoms worse. You may believe your symptoms cannot be managed or will never improve. However, there are many things you can try to help manage your symptoms. There is hope. Managing stress  Stress is your body's reaction to life changes and events, both good and bad. Stress can add to your feelings of depression. Learning to manage your stress can help lessen your feelings of depression. Try some of the following approaches to reducing your stress (stress reduction techniques): Listen to music that you enjoy and that inspires you. Try using a meditation app or take a meditation class. Develop a practice that helps you connect with your spiritual self. Walk in nature, pray, or go to a place of worship. Practice deep breathing. To do this, inhale slowly through your nose. Pause at the top of your inhale for a few seconds and then exhale slowly, letting yourself relax. Repeat this three or four times. Practice yoga to help relax and work your muscles. Choose a stress reduction technique that works for you. These techniques take time and practice to develop. Set aside 5-15 minutes a day to do them. Therapists can offer training in these techniques. Do these things to help manage stress: Keep a journal. Know your limits. Set healthy boundaries for  yourself and others, such as saying "no" when you think something is too much. Pay attention to how you react to certain situations. You may not be able to control everything, but you can change your reaction. Add humor to your life by watching funny movies or shows. Make time for activities that you enjoy and that relax you. Spend less time using electronics, especially at night before bed. The light from screens can make your brain think it is time to get up rather than go to bed.  Medicines Medicines, such as antidepressants, are often a part of treatment for depression. Talk with your pharmacist or health care provider about all the medicines, supplements, and herbal products that you take, their possible side effects, and what medicines and other products are safe to take together. Make sure to report any side effects you may have to your health care provider. Relationships Your health care provider may suggest family therapy, couples therapy, or individual therapy as part of your treatment. How to recognize changes Everyone responds differently to treatment for depression. As you recover from depression, you may start to: Have more interest in doing activities. Feel more hopeful. Have more energy. Eat a more regular amount of food. Have better mental focus. It is important to recognize if your depression is not getting better or is getting worse. The symptoms you had in the beginning may return, such as: Feeling tired. Eating too much or too little. Sleeping too much or too little. Feeling restless, agitated, or hopeless. Trouble focusing or making decisions. Having unexplained aches and pains. Feeling irritable, angry, or aggressive.  If you or your family members notice these symptoms coming back, let your health care provider know right away. Follow these instructions at home: Activity Try to get some form of exercise each day, such as walking. Try yoga, mindfulness, or other  stress reduction techniques. Participate in group activities if you are able. Lifestyle Get enough sleep. Cut down on or stop using caffeine, tobacco, alcohol, and any other harmful substances. Eat a healthy diet that includes plenty of vegetables, fruits, whole grains, low-fat dairy products, and lean protein. Limit foods that are high in solid fats, added sugar, or salt (sodium). General instructions Take over-the-counter and prescription medicines only as told by your health care provider. Keep all follow-up visits. It is important for your health care provider to check on your mood, behavior, and medicines. Your health care provider may need to make changes to your treatment. Where to find support Talking to others  Friends and family members can be sources of support and guidance. Talk to trusted friends or family members about your condition. Explain your symptoms and let them know that you are working with a health care provider to treat your depression. Tell friends and family how they can help. Finances Find mental health providers that fit with your financial situation. Talk with your health care provider if you are worried about access to food, housing, or medicine. Call your insurance company to learn about your co-pays and prescription plan. Where to find more information You can find support in your area from: Anxiety and Depression Association of America (ADAA): adaa.org Mental Health America: mentalhealthamerica.net [employer] on Mental Illness: nami.org Contact a health care provider if: You stop taking your antidepressant medicines, and you have any of these symptoms: Nausea. Headache. Light-headedness. Chills and body aches. Not being able to sleep (insomnia). You or your friends and family think your depression is getting worse. Get help right away if: You have thoughts of hurting yourself or others. Get help right away if you feel like you may hurt yourself  or others, or have thoughts about taking your own life. Go to your nearest emergency room or: Call 911. Call the National Suicide Prevention Lifeline at (873) 678-9324 or 988. This is open 24 hours a day. Text the Crisis Text Line at 415-144-9540. This information is not intended to replace advice given to you by your health care provider. Make sure you discuss any questions you have with your health care provider. Document Revised: 12/10/2021 Document Reviewed: 12/10/2021 Elsevier Patient Education  2024 [employer].

## 2024-01-20 ENCOUNTER — Ambulatory Visit (INDEPENDENT_AMBULATORY_CARE_PROVIDER_SITE_OTHER): Payer: Self-pay | Admitting: Nurse Practitioner

## 2024-01-20 ENCOUNTER — Encounter: Payer: Self-pay | Admitting: Nurse Practitioner

## 2024-01-20 ENCOUNTER — Ambulatory Visit: Payer: Self-pay | Admitting: Nurse Practitioner

## 2024-01-20 VITALS — BP 123/70 | HR 84 | Temp 98.1°F | Ht 69.6 in | Wt 239.4 lb

## 2024-01-20 DIAGNOSIS — E6609 Other obesity due to excess calories: Secondary | ICD-10-CM

## 2024-01-20 DIAGNOSIS — R7309 Other abnormal glucose: Secondary | ICD-10-CM

## 2024-01-20 DIAGNOSIS — R1031 Right lower quadrant pain: Secondary | ICD-10-CM

## 2024-01-20 DIAGNOSIS — E66811 Obesity, class 1: Secondary | ICD-10-CM

## 2024-01-20 DIAGNOSIS — G4733 Obstructive sleep apnea (adult) (pediatric): Secondary | ICD-10-CM

## 2024-01-20 DIAGNOSIS — E782 Mixed hyperlipidemia: Secondary | ICD-10-CM

## 2024-01-20 DIAGNOSIS — I10 Essential (primary) hypertension: Secondary | ICD-10-CM

## 2024-01-20 DIAGNOSIS — R7989 Other specified abnormal findings of blood chemistry: Secondary | ICD-10-CM

## 2024-01-20 DIAGNOSIS — Z0000 Encounter for general adult medical examination without abnormal findings: Secondary | ICD-10-CM

## 2024-01-20 DIAGNOSIS — F339 Major depressive disorder, recurrent, unspecified: Secondary | ICD-10-CM

## 2024-01-20 DIAGNOSIS — Z6834 Body mass index (BMI) 34.0-34.9, adult: Secondary | ICD-10-CM

## 2024-01-20 DIAGNOSIS — G629 Polyneuropathy, unspecified: Secondary | ICD-10-CM

## 2024-01-20 LAB — MICROALBUMIN, URINE WAIVED
Creatinine, Urine Waived: 200 mg/dL (ref 10–300)
Microalb, Ur Waived: 30 mg/L — ABNORMAL HIGH (ref 0–19)
Microalb/Creat Ratio: <30 mg/g (ref <30)

## 2024-01-20 LAB — BAYER DCA HB A1C WAIVED: HB A1C (BAYER DCA - WAIVED): 5.8 % — ABNORMAL HIGH (ref 4.8–5.6)

## 2024-01-20 MED ORDER — BENAZEPRIL HCL 20 MG PO TABS: 20.0000 mg | ORAL_TABLET | Freq: Every day | ORAL | 4 refills | Status: AC

## 2024-01-20 MED ORDER — HYDROCHLOROTHIAZIDE 12.5 MG PO TABS: 12.5000 mg | ORAL_TABLET | Freq: Every day | ORAL | 4 refills | Status: AC

## 2024-01-20 MED ORDER — FLUOXETINE HCL 40 MG PO CAPS: 40.0000 mg | ORAL_CAPSULE | Freq: Every day | ORAL | 4 refills | Status: AC

## 2024-01-20 MED ORDER — BUPROPION HCL ER (XL) 300 MG PO TB24: 300.0000 mg | ORAL_TABLET | Freq: Every day | ORAL | 4 refills | Status: AC

## 2024-01-20 MED ORDER — TIZANIDINE HCL 4 MG PO TABS: 4.0000 mg | ORAL_TABLET | Freq: Four times a day (QID) | ORAL | 1 refills | Status: AC | PRN

## 2024-01-20 NOTE — Assessment & Plan Note (Signed)
 Ongoing and using CPAP 100% at this time, however having issues with mask.  Provided him # for [facility] to call and alert to issues, possibly obtain new mask.  Overall good compliance.

## 2024-01-20 NOTE — Assessment & Plan Note (Signed)
Followed by urology and receiving treatment. Recent notes reviewed. Continue this collaboration.

## 2024-01-20 NOTE — Assessment & Plan Note (Signed)
BMI 34.75.  Recommended eating smaller high protein, low fat meals more frequently and exercising 30 mins a day 5 times a week with a goal of 10-15lb weight loss in the next 3 months. Patient voiced their understanding and motivation to adhere to these recommendations.  

## 2024-01-20 NOTE — Assessment & Plan Note (Signed)
 Acute with recent imaging by urology overall reassuring.  Suspect more muscular in nature as started after some heavier lifting.  Recommend he trial using his Tizanidine  as needed, discussed that this could cause fatigue so not to take while driving or working.  He plans to go to chiropractor as back is hurting too.  Return to office if does not improve.

## 2024-01-20 NOTE — Assessment & Plan Note (Signed)
 Last check 5.7%.  Diet controlled.  Will recheck today.  No symptoms.

## 2024-01-20 NOTE — Assessment & Plan Note (Signed)
 Chronic, ongoing.  Denies SI/HI.  Will continue Prozac  at 40 MG and Wellbutrin  XL 300 MG at this time.  Discussed at length with patient medication and side effects.  Aware of BLACK BOX warning and to report immediately if SI presents.

## 2024-01-20 NOTE — Assessment & Plan Note (Signed)
 Chronic, ongoing.  BP at goal on recheck today. Recommend she monitor BP at least a few mornings a week at home and document.  DASH diet at home.  Continue current medication regimen and adjust as needed.  Labs today: CBC, TSH, CMP, Lipid.  Refills sent in.

## 2024-01-20 NOTE — Assessment & Plan Note (Signed)
 Chronic, ongoing.  Continue current medication regimen and adjust as needed. Lipid panel today.

## 2024-01-20 NOTE — Progress Notes (Signed)
 Contacted via MyChart   The A1c is the diabetes testing we talked about, this looks at your blood sugars over the past 3 months and turns the average into a number.  Your number is 5.8%, meaning you are prediabetic.  Any number 5.7 to 6.4 is considered prediabetes and any number 6.5 or greater is considered diabetes.  I would recommend heavy focus on decreasing foods high in sugar and your intake of things like bread products, pasta, and rice.  The American Diabetes Association online has a large amount of information on diet changes to make. We will continue to monitor at visits.  Have a good day.

## 2024-01-20 NOTE — Assessment & Plan Note (Signed)
Chronic, ongoing.  Followed by neurology, will continue this collaboration.  Recent notes reviewed and labs.

## 2024-01-20 NOTE — Progress Notes (Signed)
 BP 123/70   Pulse 84   Temp 98.1 F (36.7 C) (Oral)   Ht 5' 9.6" (1.768 m)   Wt 239 lb 6.4 oz (108.6 kg)   SpO2 98%   BMI 34.75 kg/m    Subjective:    Patient ID: Peter Bennett, male    DOB: 1960/09/16, 63 y.o.   MRN: [id number]  HPI: Peter Bennett is a 63 y.o. male presenting on 01/20/2024 for comprehensive medical examination. Current medical complaints include:none  He currently lives with: self Menopausal Symptoms: no  HYPERTENSION / HYPERLIPIDEMIA Taking Benazepril , HCTZ, and Rosuvastatin . Satisfied with current treatment? yes Duration of hypertension: chronic BP monitoring frequency: not checking BP range:  BP medication side effects: no Duration of hyperlipidemia: chronic Cholesterol medication side effects: no Cholesterol supplements: none Medication compliance: good compliance Aspirin : yes Recent stressors: no Recurrent headaches: no Visual changes: no Palpitations: no Dyspnea: no Chest pain: no Lower extremity edema: no Dizzy/lightheaded: no   SLEEP APNEA Diagnosed on 06/25/23 with severe OSA. CPAP currently and is using this 100% of the time with 7-8 hours of sleep.   Reports he initially slept good with this, but now is not sleeping good again.  Uses nasal mask and feels like he does not have a tight seal -- makes noise with this. Got equipment via [employer].  Sleep apnea status: stable Duration: weeks Satisfied with current treatment?:  yes CPAP use: yes Sleep quality with CPAP use: excellent Treament compliance: good compliance Last sleep study: 06/25/23 Treatments attempted: CPAP Wakes feeling refreshed:  yes Daytime hypersomnolence:  no Fatigue: yes Insomnia:  no Good sleep hygiene:  yes Difficulty falling asleep:  no Difficulty staying asleep:  yes, as above Obesity:  yes Hypertension: yes  Pulmonary hypertension:  no Coronary artery disease:  no  Impaired Fasting Glucose Is followed by neurology for neuropathy discomfort.  Takes  Lyrica and Tizanidine .  Last neurology visit was 12/10/23 when they switched him to Lyrica - this is working better he states. HbA1C:  Lab Results  Component Value Date   HGBA1C 5.7 (H) 12/31/2022  Duration of elevated blood sugar:  Polydipsia: no Polyuria: no Weight change: no Visual disturbance: no Glucose Monitoring: no    Accucheck frequency: Not Checking    Fasting glucose:     Post prandial:  Diabetic Education: Not Completed Family history of diabetes: no   DEPRESSION Continues Wellbutrin  XL 300 MG daily and Prozac  40 MG daily.  Continues on Testosterone  for low levels, saw urology last on 12/24/23.   Recently worked up for kidney stone with them, which was negative.  Is having pain to right lower groin area and back.  This has gradually become worse.  Steady pain to RLQ, recent imaging (CT) on 01/08/24 noted no appendicitis.  There was a moderate volume of stool noted, denies any constipation issues.  RLQ hurts the most when moving. Prior to pain had been lifting can with trash in it and something pulled. Mood status: stable Satisfied with current treatment?: yes Symptom severity: moderate  Duration of current treatment : chronic Side effects: no Medication compliance: good compliance Psychotherapy/counseling: none Depressed mood: occasional, more aggravated than anything - with rain Anxious mood: occasional Anhedonia: no Significant weight loss or gain: no Insomnia: none Fatigue: yes due to CPAP mask not working Feelings of worthlessness or guilt: no Impaired concentration/indecisiveness: no Suicidal ideations: no Hopelessness: no Crying spells: no    01/20/2024    8:42 AM 09/16/2023    2:25 PM 07/22/2023  4:16 PM 03/10/2023    9:39 AM 12/31/2022    9:04 AM  Depression screen PHQ 2/9  Decreased Interest 1 1 1 2 1   Down, Depressed, Hopeless 1 1 1 1 1   PHQ - 2 Score 2 2 2 3 2   Altered sleeping 1 1 1 3 1   Tired, decreased energy 1 1 2 3 2   Change in appetite 1 0 1 1  1   Feeling bad or failure about yourself  1 0 1 1 1   Trouble concentrating 1 0 0 1 1  Moving slowly or fidgety/restless 0 0 0 1 1  Suicidal thoughts 0 0 0 0 0  PHQ-9 Score 7 4 7 13 9   Difficult doing work/chores Somewhat difficult Somewhat difficult Somewhat difficult Somewhat difficult Somewhat difficult      01/20/2024    8:42 AM 09/16/2023    2:25 PM 07/22/2023    4:16 PM 03/10/2023    9:40 AM  GAD 7 : Generalized Anxiety Score  Nervous, Anxious, on Edge 1 0 1 1  Control/stop worrying 1 1 0 1  Worry too much - different things 2 2 1 1   Trouble relaxing 1 2 1 1   Restless 1 0 0 1  Easily annoyed or irritable 1 2 1 1   Afraid - awful might happen 1 2 1 1   Total GAD 7 Score 8 9 5 7   Anxiety Difficulty Somewhat difficult Somewhat difficult Somewhat difficult Somewhat difficult        08/07/2022    2:09 PM 12/31/2022    9:04 AM 03/10/2023    9:38 AM 09/16/2023    2:23 PM 01/20/2024    8:40 AM  Fall Risk  Falls in the past year? 1 1 1  0 0  Was there an injury with Fall? 0 0 1 0 0  Fall Risk Category Calculator 2 2 3  0 0  Fall Risk Category (Retired) Moderate      Patient at Risk for Falls Due to History of fall(s) History of fall(s) No Fall Risks No Fall Risks No Fall Risks  Fall risk Follow up Falls evaluation completed Falls evaluation completed Falls evaluation completed Falls evaluation completed Falls evaluation completed    Past Medical History:  Past Medical History:  Diagnosis Date   Allergy    Depression    Hyperlipidemia    Hypertension    Osteoarthritis    Sleep apnea     Surgical History:  Past Surgical History:  Procedure Laterality Date   Bicep reattachmet     disk repair     FOOT SURGERY Right    TONSILLECTOMY      Medications:  Current Outpatient Medications on File Prior to Visit  Medication Sig   aspirin  81 MG tablet Take 81 mg by mouth daily.   Cholecalciferol 25 MCG (1000 UT) capsule Take 1,000 Units by mouth daily.    fluticasone  (FLONASE ) 50  MCG/ACT nasal spray Place 1 spray into both nostrils daily as needed for allergies or rhinitis.   glucosamine-chondroitin 500-400 MG tablet Take 1 tablet by mouth 3 (three) times daily.   Multiple Vitamin (MULTIVITAMIN) tablet Take 1 tablet by mouth daily.   Omega-3 Fatty Acids (FISH OIL) 1000 MG CAPS Take 1,000 mg by mouth daily.    pregabalin (LYRICA) 25 MG capsule Take by mouth 2 (two) times daily.   rosuvastatin  (CRESTOR ) 20 MG tablet Take 1 tablet (20 mg total) by mouth daily.   No current facility-administered medications on file prior to visit.  Allergies:  No Known Allergies  Social History:  Social History   Socioeconomic History   Marital status: Divorced    Spouse name: Not on file   Number of children: Not on file   Years of education: Not on file   Highest education level: Not on file  Occupational History   Not on file  Tobacco Use   Smoking status: Never   Smokeless tobacco: Current    Types: Chew  Vaping Use   Vaping status: Never Used  Substance and Sexual Activity   Alcohol use: No    Alcohol/week: 0.0 standard drinks of alcohol   Drug use: No   Sexual activity: Yes  Other Topics Concern   Not on file  Social History Narrative   Not on file   Social Drivers of Health   Financial Resource Strain: Low Risk  (01/20/2024)   Overall Financial Resource Strain (CARDIA)    Difficulty of Paying Living Expenses: Not very hard  Food Insecurity: No Food Insecurity (01/20/2024)   Hunger Vital Sign    Worried About Running Out of Food in the Last Year: Never true    Ran Out of Food in the Last Year: Never true  Transportation Needs: No Transportation Needs (01/20/2024)   PRAPARE - Administrator, Civil Service (Medical): No    Lack of Transportation (Non-Medical): No  Physical Activity: Sufficiently Active (01/20/2024)   Exercise Vital Sign    Days of Exercise per Week: 5 days    Minutes of Exercise per Session: 60 min  Stress: No Stress Concern  Present (01/20/2024)   [employer] of Occupational Health - Occupational Stress Questionnaire    Feeling of Stress : Only a little  Social Connections: Socially Isolated (01/20/2024)   Social Connection and Isolation Panel [NHANES]    Frequency of Communication with Friends and Family: More than three times a week    Frequency of Social Gatherings with Friends and Family: More than three times a week    Attends Religious Services: Never    Database administrator or Organizations: No    Attends Banker Meetings: Never    Marital Status: Divorced  Catering manager Violence: Not At Risk (01/20/2024)   Humiliation, Afraid, Rape, and Kick questionnaire    Fear of Current or Ex-Partner: No    Emotionally Abused: No    Physically Abused: No    Sexually Abused: No   Social History   Tobacco Use  Smoking Status Never  Smokeless Tobacco Current   Types: Chew   Social History   Substance and Sexual Activity  Alcohol Use No   Alcohol/week: 0.0 standard drinks of alcohol    Family History:  Family History  Problem Relation Age of Onset   Hypertension Mother    Heart disease Father     Past medical history, surgical history, medications, allergies, family history and social history reviewed with patient today and changes made to appropriate areas of the chart.   ROS All other ROS negative except what is listed above and in the HPI.      Objective:     BP 123/70   Pulse 84   Temp 98.1 F (36.7 C) (Oral)   Ht 5' 9.6" (1.768 m)   Wt 239 lb 6.4 oz (108.6 kg)   SpO2 98%   BMI 34.75 kg/m   Wt Readings from Last 3 Encounters:  01/20/24 239 lb 6.4 oz (108.6 kg)  12/24/23 240 lb (108.9 kg)  09/16/23 245 lb 12.8 oz (111.5 kg)    Physical Exam Vitals and nursing note reviewed.  Constitutional:      General: He is awake. He is not in acute distress.    Appearance: He is well-developed and well-groomed. He is obese. He is not ill-appearing or toxic-appearing.   HENT:     Head: Normocephalic and atraumatic.     Right Ear: Hearing, tympanic membrane, ear canal and external ear normal. No drainage.     Left Ear: Hearing, tympanic membrane, ear canal and external ear normal. No drainage.     Nose: Nose normal.     Mouth/Throat:     Pharynx: Uvula midline.  Eyes:     General: Lids are normal.        Right eye: No discharge.        Left eye: No discharge.     Extraocular Movements: Extraocular movements intact.     Conjunctiva/sclera: Conjunctivae normal.     Pupils: Pupils are equal, round, and reactive to light.     Visual Fields: Right eye visual fields normal and left eye visual fields normal.  Neck:     Thyroid : No thyromegaly.     Vascular: No carotid bruit or JVD.     Trachea: Trachea normal.  Cardiovascular:     Rate and Rhythm: Normal rate and regular rhythm.     Heart sounds: Normal heart sounds, S1 normal and S2 normal. No murmur heard.    No gallop.  Pulmonary:     Effort: Pulmonary effort is normal. No accessory muscle usage or respiratory distress.     Breath sounds: Normal breath sounds.  Abdominal:     General: Bowel sounds are normal.     Palpations: Abdomen is soft. There is no hepatomegaly or splenomegaly.     Tenderness: There is abdominal tenderness in the right lower quadrant. There is no right CVA tenderness, left CVA tenderness or guarding. Negative signs include Rovsing's sign, psoas sign and obturator sign.     Hernia: A hernia is present. Hernia is present in the umbilical area.  Musculoskeletal:        General: Normal range of motion.     Cervical back: Normal range of motion and neck supple.     Right lower leg: No edema.     Left lower leg: No edema.  Lymphadenopathy:     Head:     Right side of head: No submental, submandibular, tonsillar, preauricular or posterior auricular adenopathy.     Left side of head: No submental, submandibular, tonsillar, preauricular or posterior auricular adenopathy.      Cervical: No cervical adenopathy.  Skin:    General: Skin is warm and dry.     Capillary Refill: Capillary refill takes less than 2 seconds.     Findings: No rash.  Neurological:     Mental Status: He is alert and oriented to person, place, and time.     Gait: Gait is intact.     Deep Tendon Reflexes: Reflexes are normal and symmetric.     Reflex Scores:      Brachioradialis reflexes are 2+ on the right side and 2+ on the left side.      Patellar reflexes are 2+ on the right side and 2+ on the left side. Psychiatric:        Attention and Perception: Attention normal.        Mood and Affect: Mood normal.        Speech:  Speech normal.        Behavior: Behavior normal. Behavior is cooperative.        Thought Content: Thought content normal.        Cognition and Memory: Cognition normal.    Results for orders placed or performed in visit on 12/24/23  Microscopic Examination   Collection Time: 12/24/23 10:38 AM   Urine  Result Value Ref Range   WBC, UA 0-5 0 - 5 /hpf   RBC, Urine 0-2 0 - 2 /hpf   Epithelial Cells (non renal) 0-10 0 - 10 /hpf   Bacteria, UA None seen None seen/Few  Urinalysis, Complete   Collection Time: 12/24/23 10:38 AM  Result Value Ref Range   Specific Gravity, UA 1.020 1.005 - 1.030   pH, UA 6.0 5.0 - 7.5   Color, UA Yellow Yellow   Appearance Ur Clear Clear   Leukocytes,UA Negative Negative   Protein,UA Negative Negative/Trace   Glucose, UA Negative Negative   Ketones, UA Negative Negative   RBC, UA Trace (A) Negative   Bilirubin, UA Negative Negative   Urobilinogen, Ur 0.2 0.2 - 1.0 mg/dL   Nitrite, UA Negative Negative   Microscopic Examination See below:       Assessment & Plan:   Problem List Items Addressed This Visit       Cardiovascular and Mediastinum   Hypertension   Chronic, ongoing.  BP at goal on recheck today. Recommend she monitor BP at least a few mornings a week at home and document.  DASH diet at home.  Continue current  medication regimen and adjust as needed.  Labs today: CBC, TSH, CMP, Lipid.  Refills sent in.        Relevant Medications   hydrochlorothiazide  (HYDRODIURIL ) 12.5 MG tablet   benazepril  (LOTENSIN ) 20 MG tablet   Other Relevant Orders   Microalbumin, Urine Waived   CBC with Differential/Platelet   Comprehensive metabolic panel with GFR   TSH     Respiratory   Severe obstructive sleep apnea   Ongoing and using CPAP 100% at this time, however having issues with mask.  Provided him # for [employer] to call and alert to issues, possibly obtain new mask.  Overall good compliance.        Nervous and Auditory   Neuropathy   Chronic, ongoing.  Followed by neurology, will continue this collaboration.  Recent notes reviewed and labs.        Other   Obesity   BMI 34.75.  Recommended eating smaller high protein, low fat meals more frequently and exercising 30 mins a day 5 times a week with a goal of 10-15lb weight loss in the next 3 months. Patient voiced their understanding and motivation to adhere to these recommendations.       Low testosterone  level in male   Followed by urology and receiving treatment. Recent notes reviewed. Continue this collaboration.      Relevant Orders   PSA   Hyperlipidemia   Chronic, ongoing.  Continue current medication regimen and adjust as needed.  Lipid panel today.          Relevant Medications   hydrochlorothiazide  (HYDRODIURIL ) 12.5 MG tablet   benazepril  (LOTENSIN ) 20 MG tablet   Other Relevant Orders   Comprehensive metabolic panel with GFR   Lipid Panel w/o Chol/HDL Ratio   Elevated hemoglobin A1c measurement   Last check 5.7%.  Diet controlled.  Will recheck today.  No symptoms.      Relevant Orders  Bayer DCA Hb A1c Waived   Microalbumin, Urine Waived   Depression, recurrent (HCC) - Primary   Chronic, ongoing.  Denies SI/HI.  Will continue Prozac  at 40 MG and Wellbutrin  XL 300 MG at this time.  Discussed at length with patient  medication and side effects.  Aware of BLACK BOX warning and to report immediately if SI presents.        Relevant Medications   buPROPion  (WELLBUTRIN  XL) 300 MG 24 hr tablet   FLUoxetine  (PROZAC ) 40 MG capsule   Abdominal pain, RLQ   Acute with recent imaging by urology overall reassuring.  Suspect more muscular in nature as started after some heavier lifting.  Recommend he trial using his Tizanidine  as needed, discussed that this could cause fatigue so not to take while driving or working.  He plans to go to chiropractor as back is hurting too.  Return to office if does not improve.      Other Visit Diagnoses       Encounter for annual physical exam       Annual physical today with labs and health maintenance reviewed, discussed with patient.        Follow up plan: Return in about 6 months (around 07/21/2024) for HTN/HLD, Depression, OSA, IFG.   Discussed aspirin  prophylaxis for myocardial infarction prevention and decision was made to continue ASA  LABORATORY TESTING:  Health maintenance labs ordered today as discussed above.   The natural history of prostate cancer and ongoing controversy regarding screening and potential treatment outcomes of prostate cancer has been discussed with the patient. The meaning of a false positive PSA and a false negative PSA has been discussed. He indicates understanding of the limitations of this screening test and wishes to proceed with screening PSA testing.  IMMUNIZATIONS:   - Tdap: Tetanus vaccination status reviewed: last tetanus booster within 10 years. - Influenza: Up to date - Pneumovax: Up to date - Prevnar: Up to date - Zostavax vaccine: Refused  SCREENING: - Colonoscopy: Refused  Discussed with patient purpose of the colonoscopy is to detect colon cancer at curable precancerous or early stages   - AAA Screening: Not applicable  -Hearing Test: Not applicable  -Spirometry: Not applicable   PATIENT COUNSELING:   Advised to take  1 mg of folate supplement per day if capable of pregnancy.   Sexuality: Discussed sexually transmitted diseases, partner selection, use of condoms, avoidance of unintended pregnancy  and contraceptive alternatives.   Advised to avoid cigarette smoking.  I discussed with the patient that most people either abstain from alcohol or drink within safe limits (<=14/week and <=4 drinks/occasion for males, <=7/weeks and <= 3 drinks/occasion for females) and that the risk for alcohol disorders and other health effects rises proportionally with the number of drinks per week and how often a drinker exceeds daily limits.  Discussed cessation/primary prevention of drug use and availability of treatment for abuse.   Diet: Encouraged to adjust caloric intake to maintain  or achieve ideal body weight, to reduce intake of dietary saturated fat and total fat, to limit sodium intake by avoiding high sodium foods and not adding table salt, and to maintain adequate dietary potassium and calcium  preferably from fresh fruits, vegetables, and low-fat dairy products.    Stressed the importance of regular exercise  Injury prevention: Discussed safety belts, safety helmets, smoke detector, smoking near bedding or upholstery.   Dental health: Discussed importance of regular tooth brushing, flossing, and dental visits.    NEXT PREVENTATIVE  PHYSICAL DUE IN 1 YEAR. Return in about 6 months (around 07/21/2024) for HTN/HLD, Depression, OSA, IFG.

## 2024-01-21 LAB — CBC WITH DIFFERENTIAL/PLATELET
Basophils Absolute: 0.1 10*3/uL (ref 0.0–0.2)
Basos: 1 %
EOS (ABSOLUTE): 0.3 10*3/uL (ref 0.0–0.4)
Eos: 5 %
Hematocrit: 41.2 % (ref 37.5–51.0)
Hemoglobin: 13.2 g/dL (ref 13.0–17.7)
Immature Grans (Abs): 0 10*3/uL (ref 0.0–0.1)
Immature Granulocytes: 0 %
Lymphocytes Absolute: 1.7 10*3/uL (ref 0.7–3.1)
Lymphs: 28 %
MCH: 30.3 pg (ref 26.6–33.0)
MCHC: 32 g/dL (ref 31.5–35.7)
MCV: 95 fL (ref 79–97)
Monocytes Absolute: 0.5 10*3/uL (ref 0.1–0.9)
Monocytes: 8 %
Neutrophils Absolute: 3.6 10*3/uL (ref 1.4–7.0)
Neutrophils: 58 %
Platelets: 191 10*3/uL (ref 150–450)
RBC: 4.36 x10E6/uL (ref 4.14–5.80)
RDW: 13.2 % (ref 11.6–15.4)
WBC: 6.2 10*3/uL (ref 3.4–10.8)

## 2024-01-21 LAB — LIPID PANEL W/O CHOL/HDL RATIO
Cholesterol, Total: 148 mg/dL (ref 100–199)
HDL: 36 mg/dL — ABNORMAL LOW (ref >39)
LDL Chol Calc (NIH): 84 mg/dL (ref 0–99)
Triglycerides: 163 mg/dL — ABNORMAL HIGH (ref 0–149)
VLDL Cholesterol Cal: 28 mg/dL (ref 5–40)

## 2024-01-21 LAB — COMPREHENSIVE METABOLIC PANEL WITH GFR
ALT: 23 IU/L (ref 0–44)
AST: 24 IU/L (ref 0–40)
Albumin: 4.4 g/dL (ref 3.9–4.9)
Alkaline Phosphatase: 68 IU/L (ref 44–121)
BUN/Creatinine Ratio: 25 — ABNORMAL HIGH (ref 10–24)
BUN: 24 mg/dL (ref 8–27)
Bilirubin Total: 0.3 mg/dL (ref 0.0–1.2)
CO2: 21 mmol/L (ref 20–29)
Calcium: 9.3 mg/dL (ref 8.6–10.2)
Chloride: 103 mmol/L (ref 96–106)
Creatinine, Ser: 0.95 mg/dL (ref 0.76–1.27)
Globulin, Total: 2.4 g/dL (ref 1.5–4.5)
Glucose: 94 mg/dL (ref 70–99)
Potassium: 3.8 mmol/L (ref 3.5–5.2)
Sodium: 140 mmol/L (ref 134–144)
Total Protein: 6.8 g/dL (ref 6.0–8.5)
eGFR: 89 mL/min/{1.73_m2} (ref >59)

## 2024-01-21 LAB — TSH: TSH: 2.91 u[IU]/mL (ref 0.450–4.500)

## 2024-01-21 LAB — PSA: Prostate Specific Ag, Serum: 1.5 ng/mL (ref 0.0–4.0)

## 2024-01-21 NOTE — Progress Notes (Signed)
 Contacted via MyChart   Good morning Peter Bennett, the remainder of your labs have returned: - Kidney function, creatinine and eGFR, remains normal, as is liver function, AST and ALT.  - Lipid panel would benefit a little more lowering, would you be okay with changing to Rosuvastatin  40 MG and stopping the 20 MG dosing? Please let me know and I will send in change. - Remainder of labs are all stable.  Any questions? Keep being amazing!!  Thank you for allowing me to participate in your care.  I appreciate you. Kindest regards, Makisha Marrin

## 2024-04-10 NOTE — Patient Instructions (Incomplete)
 Managing Depression, Adult Depression is a mental health condition that affects your thoughts, feelings, and actions. Being diagnosed with depression can bring you relief if you did not know why you have felt or behaved a certain way. It could also leave you feeling overwhelmed. Finding ways to manage your symptoms can help you feel more positive about your future. How to manage lifestyle changes Being depressed is difficult. Depression can increase the level of everyday stress. Stress can make depression symptoms worse. You may believe your symptoms cannot be managed or will never improve. However, there are many things you can try to help manage your symptoms. There is hope. Managing stress  Stress is your body's reaction to life changes and events, both good and bad. Stress can add to your feelings of depression. Learning to manage your stress can help lessen your feelings of depression. Try some of the following approaches to reducing your stress (stress reduction techniques): Listen to music that you enjoy and that inspires you. Try using a meditation app or take a meditation class. Develop a practice that helps you connect with your spiritual self. Walk in nature, pray, or go to a place of worship. Practice deep breathing. To do this, inhale slowly through your nose. Pause at the top of your inhale for a few seconds and then exhale slowly, letting yourself relax. Repeat this three or four times. Practice yoga to help relax and work your muscles. Choose a stress reduction technique that works for you. These techniques take time and practice to develop. Set aside 5-15 minutes a day to do them. Therapists can offer training in these techniques. Do these things to help manage stress: Keep a journal. Know your limits. Set healthy boundaries for yourself and others, such as saying "no" when you think something is too much. Pay attention to how you react to certain situations. You may not be able to  control everything, but you can change your reaction. Add humor to your life by watching funny movies or shows. Make time for activities that you enjoy and that relax you. Spend less time using electronics, especially at night before bed. The light from screens can make your brain think it is time to get up rather than go to bed.  Medicines Medicines, such as antidepressants, are often a part of treatment for depression. Talk with your pharmacist or health care provider about all the medicines, supplements, and herbal products that you take, their possible side effects, and what medicines and other products are safe to take together. Make sure to report any side effects you may have to your health care provider. Relationships Your health care provider may suggest family therapy, couples therapy, or individual therapy as part of your treatment. How to recognize changes Everyone responds differently to treatment for depression. As you recover from depression, you may start to: Have more interest in doing activities. Feel more hopeful. Have more energy. Eat a more regular amount of food. Have better mental focus. It is important to recognize if your depression is not getting better or is getting worse. The symptoms you had in the beginning may return, such as: Feeling tired. Eating too much or too little. Sleeping too much or too little. Feeling restless, agitated, or hopeless. Trouble focusing or making decisions. Having unexplained aches and pains. Feeling irritable, angry, or aggressive. If you or your family members notice these symptoms coming back, let your health care provider know right away. Follow these instructions at home: Activity Try to  get some form of exercise each day, such as walking. Try yoga, mindfulness, or other stress reduction techniques. Participate in group activities if you are able. Lifestyle Get enough sleep. Cut down on or stop using caffeine, tobacco,  alcohol, and any other harmful substances. Eat a healthy diet that includes plenty of vegetables, fruits, whole grains, low-fat dairy products, and lean protein. Limit foods that are high in solid fats, added sugar, or salt (sodium). General instructions Take over-the-counter and prescription medicines only as told by your health care provider. Keep all follow-up visits. It is important for your health care provider to check on your mood, behavior, and medicines. Your health care provider may need to make changes to your treatment. Where to find support Talking to others  Friends and family members can be sources of support and guidance. Talk to trusted friends or family members about your condition. Explain your symptoms and let them know that you are working with a health care provider to treat your depression. Tell friends and family how they can help. Finances Find mental health providers that fit with your financial situation. Talk with your health care provider if you are worried about access to food, housing, or medicine. Call your insurance company to learn about your co-pays and prescription plan. Where to find more information You can find support in your area from: Anxiety and Depression Association of America (ADAA): adaa.org Mental Health America: mentalhealthamerica.net [employer] on Mental Illness: nami.org Contact a health care provider if: You stop taking your antidepressant medicines, and you have any of these symptoms: Nausea. Headache. Light-headedness. Chills and body aches. Not being able to sleep (insomnia). You or your friends and family think your depression is getting worse. Get help right away if: You have thoughts of hurting yourself or others. Get help right away if you feel like you may hurt yourself or others, or have thoughts about taking your own life. Go to your nearest emergency room or: Call 911. Call the National Suicide Prevention Lifeline at  (215) 435-0408 or 988. This is open 24 hours a day. Text the Crisis Text Line at 318 581 7774. This information is not intended to replace advice given to you by your health care provider. Make sure you discuss any questions you have with your health care provider. Document Revised: 12/10/2021 Document Reviewed: 12/10/2021 Elsevier Patient Education  2024 [employer].

## 2024-04-13 ENCOUNTER — Ambulatory Visit: Payer: Self-pay | Admitting: Nurse Practitioner

## 2024-05-15 NOTE — Patient Instructions (Incomplete)
 Prediabetes: What to Know Prediabetes is when your blood sugar, also called glucose, is at a higher level than normal but not high enough for you to be diagnosed with type 2 diabetes (type 2 diabetes mellitus). Having prediabetes puts you at risk for getting type 2 diabetes. By making some healthy changes, you may be able to prevent or delay getting type 2 diabetes. This is important because type 2 diabetes can lead to serious problems. Some of these include: Heart disease. Stroke. Blindness. Kidney disease. Depression. Poor blood flow in the feet and legs. In very bad cases, this could lead to having a leg removed by surgery (amputation). What are the causes? The exact cause of prediabetes isn't known. It may result from insulin resistance. Insulin resistance happens when cells in the body don't respond properly to insulin that the body makes. This can cause too much sugar to build up in the blood. High blood sugar, also called hyperglycemia, can develop. What increases the risk? Having a family member with type 2 diabetes. Being older than 65 years of age. Having had a temporary form of diabetes during a pregnancy. This is called gestational diabetes. Having had polycystic ovary syndrome (PCOS). Being overweight or obese. Being inactive and not getting much exercise. Having a history of heart disease. This may include problems with cholesterol levels, high levels of blood fats, or high blood pressure. What are the signs or symptoms? You may have no symptoms. If you do have symptoms, they may include: Increased hunger. Increased thirst. Needing to pee more often. Changes in how you see, like blurry vision. Feeling tired. How is this diagnosed? Prediabetes can be diagnosed with blood tests that check your blood sugar. One or more of these tests may be done: A fasting blood glucose (FBG) test. You won't be allowed to eat (you will fast) for at least 8 hours before a blood sample is  taken. An A1C blood test, also called a hemoglobin A1C test. This test shows information about blood sugar levels over the past 2?3 months. An oral glucose tolerance test (OGTT). This test measures your blood sugar at two points in time: After you haven't eaten for a while. This is your baseline level. Two hours after you drink a beverage that has sugar in it. You may be diagnosed with prediabetes if: Your FBG is 100?125 mg/dL (1.6-1.0 mmol/L). Your A1C level is 5.7?6.4% (39-46 mmol/mol). Your OGTT result is 140?199 mg/dL (9.6-04 mmol/L). These blood tests may need to be done again to be sure of the diagnosis. How is this treated? Treatment may include making changes to your diet and lifestyle. These changes can help lower your blood sugar and keep you from getting type 2 diabetes. In some cases, medicine may be given to help lower your risk. Follow these instructions at home: Eating and drinking  Eat and drink as told. Follow a healthy meal plan. This includes eating lean proteins, whole grains, legumes, fresh fruits and vegetables, low-fat dairy products, and healthy fats. Meet with an expert in healthy eating called a dietitian. This person can help create a healthy eating plan that's right for you. Lifestyle Do moderate-intensity exercise. Do this for at least 30 minutes a day on 5 or more days each week, or as told by your health care provider. A mix of activities may be best. Good choices include brisk walking, swimming, biking, and weight lifting. Try to lose weight if your provider says it's OK. Losing 5-7% of your body weight can  help reverse insulin resistance. Do not drink alcohol if: Your provider tells you not to drink. You're pregnant, may be pregnant, or plan to become pregnant. If you drink alcohol: Limit how much you have to: 0-1 drink a day if you're male. 0-2 drinks a day if you're male. Know how much alcohol is in your drink. In the [place] one drink is one 12 oz  bottle of beer (355 mL), one 5 oz glass of wine (148 mL), or one 1 oz glass of hard liquor (44 mL). General instructions Take medicines only as told. You may be given medicines that help lower the risk of type 2 diabetes. Do not smoke, vape, or use nicotine or tobacco. Where to find more information American Diabetes Association: diabetes.org/about-diabetes/prediabetes Academy of Nutrition and Dietetics: eatright.org American Heart Association: Go to ThisJobs.cz. Click the search icon. Type "prediabetes" in the search box. Contact a health care provider if: You have any of these symptoms: Increased hunger. Peeing more often than usual. Increased thirst. Feeling tired. Changes in how you see, like blurry vision. Feeling like you may throw up. Throwing up. Get help right away if: You have shortness of breath. You feel confused. This information is not intended to replace advice given to you by your health care provider. Make sure you discuss any questions you have with your health care provider. Document Revised: 03/08/2023 Document Reviewed: 03/08/2023 Elsevier Patient Education  2024 [employer].

## 2024-05-17 ENCOUNTER — Ambulatory Visit: Payer: Self-pay | Admitting: Nurse Practitioner

## 2024-05-17 DIAGNOSIS — E782 Mixed hyperlipidemia: Secondary | ICD-10-CM

## 2024-05-17 DIAGNOSIS — R7309 Other abnormal glucose: Secondary | ICD-10-CM

## 2024-05-17 DIAGNOSIS — G4733 Obstructive sleep apnea (adult) (pediatric): Secondary | ICD-10-CM

## 2024-05-17 DIAGNOSIS — E6609 Other obesity due to excess calories: Secondary | ICD-10-CM

## 2024-05-17 DIAGNOSIS — G629 Polyneuropathy, unspecified: Secondary | ICD-10-CM

## 2024-05-17 DIAGNOSIS — I10 Essential (primary) hypertension: Secondary | ICD-10-CM

## 2024-05-17 DIAGNOSIS — F339 Major depressive disorder, recurrent, unspecified: Secondary | ICD-10-CM

## 2024-05-18 ENCOUNTER — Encounter: Payer: Self-pay | Admitting: Nurse Practitioner

## 2024-07-19 ENCOUNTER — Ambulatory Visit: Payer: Self-pay | Admitting: Nurse Practitioner

## 2024-07-25 ENCOUNTER — Ambulatory Visit: Payer: Self-pay | Admitting: Nurse Practitioner

## 2024-08-27 NOTE — Patient Instructions (Incomplete)

## 2024-08-29 ENCOUNTER — Encounter: Payer: Self-pay | Admitting: Nurse Practitioner

## 2024-08-29 ENCOUNTER — Ambulatory Visit: Payer: Self-pay | Admitting: Nurse Practitioner

## 2024-08-29 VITALS — BP 105/69 | HR 72 | Temp 98.0°F | Resp 17 | Ht 69.61 in | Wt 202.1 lb

## 2024-08-29 DIAGNOSIS — G629 Polyneuropathy, unspecified: Secondary | ICD-10-CM

## 2024-08-29 DIAGNOSIS — Z6834 Body mass index (BMI) 34.0-34.9, adult: Secondary | ICD-10-CM

## 2024-08-29 DIAGNOSIS — I10 Essential (primary) hypertension: Secondary | ICD-10-CM

## 2024-08-29 DIAGNOSIS — E6609 Other obesity due to excess calories: Secondary | ICD-10-CM

## 2024-08-29 DIAGNOSIS — F339 Major depressive disorder, recurrent, unspecified: Secondary | ICD-10-CM

## 2024-08-29 DIAGNOSIS — E66811 Obesity, class 1: Secondary | ICD-10-CM

## 2024-08-29 DIAGNOSIS — E782 Mixed hyperlipidemia: Secondary | ICD-10-CM

## 2024-08-29 DIAGNOSIS — R7309 Other abnormal glucose: Secondary | ICD-10-CM

## 2024-08-29 DIAGNOSIS — G4733 Obstructive sleep apnea (adult) (pediatric): Secondary | ICD-10-CM

## 2024-08-29 LAB — BAYER DCA HB A1C WAIVED: HB A1C (BAYER DCA - WAIVED): 5.7 % — ABNORMAL HIGH (ref 4.8–5.6)

## 2024-08-29 LAB — MICROALBUMIN, URINE WAIVED
Creatinine, Urine Waived: 100 mg/dL (ref 10–300)
Microalb, Ur Waived: 10 mg/L (ref 0–19)
Microalb/Creat Ratio: <30 mg/g

## 2024-08-29 NOTE — Assessment & Plan Note (Signed)
Chronic, ongoing.  Followed by neurology, will continue this collaboration.  Recent notes reviewed and labs.

## 2024-08-29 NOTE — Progress Notes (Signed)
 "  BP 105/69 (BP Location: Left Arm, Patient Position: Sitting, Cuff Size: Normal)   Pulse 72   Temp 98 F (36.7 C) (Oral)   Resp 17   Ht 5' 9.61 (1.768 m)   Wt 202 lb 1.6 oz (91.7 kg)   SpO2 97%   BMI 29.33 kg/m    Subjective:    Patient ID: Peter Bennett, male    DOB: [birth year], 64 y.o.   MRN: [id number]  HPI: Peter Bennett is a 64 y.o. male  Chief Complaint  Patient presents with   Follow-up    Here follow up concerns. Would like to know if he is still pre-diabetic    Hemoglobin A1c Screening   HYPERTENSION / HYPERLIPIDEMIA Takes Benazepril , HCTZ, and Rosuvastatin . Satisfied with current treatment? yes Duration of hypertension: chronic BP monitoring frequency: not checking BP range:  BP medication side effects: no Duration of hyperlipidemia: chronic Cholesterol medication side effects: no Cholesterol supplements: none Medication compliance: good compliance Aspirin : yes Recent stressors: no Recurrent headaches: no Visual changes: no Palpitations: no Dyspnea: no Chest pain: no Lower extremity edema: no Dizzy/lightheaded: no   SLEEP APNEA Diagnosed 06/25/23 with severe OSA. CPAP currently and is using this every night, last night has over 7 hours. Sleep apnea status: stable Duration: weeks Satisfied with current treatment?:  yes CPAP use: yes Sleep quality with CPAP use: excellent Treament compliance: good compliance Last sleep study: 06/25/23 Treatments attempted: CPAP Wakes feeling refreshed:  yes Daytime hypersomnolence:  no Fatigue: yes Insomnia:  no Good sleep hygiene:  yes Difficulty falling asleep:  no Difficulty staying asleep:  yes, as above Obesity:  yes Hypertension: yes  Pulmonary hypertension:  no Coronary artery disease:  no  Impaired Fasting Glucose Taking Lyrica and Tizanidine  for neuropathy. Saw neurology last on 07/08/24, they adjusted Lyrica and plan to look at memory needs. Lost 37 lbs since last visit in June and has  been focused on diet. Is working on maintaining now. HbA1C:  Lab Results  Component Value Date   HGBA1C 5.8 (H) 01/20/2024  Duration of elevated blood sugar: chronic Polydipsia: no Polyuria: no Weight change: no Visual disturbance: no Glucose Monitoring: no    Accucheck frequency: Not Checking    Fasting glucose:     Post prandial:  Diabetic Education: Not Completed Family history of diabetes: no   DEPRESSION Takes Wellbutrin  XL 300 MG daily and Prozac  40 MG daily.  Took testosterone  in past with urology, last visit was 12/24/23.  Mood status: stable Satisfied with current treatment?: yes Symptom severity: moderate  Duration of current treatment : chronic Side effects: no Medication compliance: good compliance Psychotherapy/counseling: none Depressed mood: occasional Anxious mood: occasional Anhedonia: no Significant weight loss or gain: no Insomnia: none Fatigue: no Feelings of worthlessness or guilt: no Impaired concentration/indecisiveness: no Suicidal ideations: no Hopelessness: no Crying spells: no    08/29/2024    1:17 PM 01/20/2024    8:42 AM 09/16/2023    2:25 PM 07/22/2023    4:16 PM 03/10/2023    9:39 AM  Depression screen PHQ 2/9  Decreased Interest 1 1 1 1 2   Down, Depressed, Hopeless 1 1 1 1 1   PHQ - 2 Score 2 2 2 2 3   Altered sleeping 1 1 1 1 3   Tired, decreased energy 2 1 1 2 3   Change in appetite 0 1 0 1 1  Feeling bad or failure about yourself  1 1 0 1 1  Trouble concentrating 1 1 0 0  1  Moving slowly or fidgety/restless 0 0 0 0 1  Suicidal thoughts 0 0 0 0 0  PHQ-9 Score 7 7  4  7  13    Difficult doing work/chores Somewhat difficult Somewhat difficult Somewhat difficult Somewhat difficult Somewhat difficult     Data saved with a previous flowsheet row definition       08/29/2024    1:18 PM 01/20/2024    8:42 AM 09/16/2023    2:25 PM 07/22/2023    4:16 PM  GAD 7 : Generalized Anxiety Score  Nervous, Anxious, on Edge 1 1 0 1  Control/stop  worrying 0 1 1 0  Worry too much - different things 1 2 2 1   Trouble relaxing 0 1 2 1   Restless 0 1 0 0  Easily annoyed or irritable 0 1 2 1   Afraid - awful might happen 1 1 2 1   Total GAD 7 Score 3 8 9 5   Anxiety Difficulty Somewhat difficult Somewhat difficult Somewhat difficult Somewhat difficult   Relevant past medical, surgical, family and social history reviewed and updated as indicated. Interim medical history since our last visit reviewed. Allergies and medications reviewed and updated.  Review of Systems  Constitutional:  Negative for activity change, diaphoresis, fatigue and fever.  Respiratory:  Negative for cough, chest tightness, shortness of breath and wheezing.   Cardiovascular:  Negative for chest pain, palpitations and leg swelling.  Gastrointestinal: Negative.   Endocrine: Negative for polydipsia, polyphagia and polyuria.  Neurological: Negative.   Psychiatric/Behavioral: Negative.      Per HPI unless specifically indicated above     Objective:    BP 105/69 (BP Location: Left Arm, Patient Position: Sitting, Cuff Size: Normal)   Pulse 72   Temp 98 F (36.7 C) (Oral)   Resp 17   Ht 5' 9.61 (1.768 m)   Wt 202 lb 1.6 oz (91.7 kg)   SpO2 97%   BMI 29.33 kg/m   Wt Readings from Last 3 Encounters:  08/29/24 202 lb 1.6 oz (91.7 kg)  01/20/24 239 lb 6.4 oz (108.6 kg)  12/24/23 240 lb (108.9 kg)    Physical Exam Vitals and nursing note reviewed.  Constitutional:      General: He is awake. He is not in acute distress.    Appearance: He is well-developed, well-groomed and overweight. He is not ill-appearing or toxic-appearing.  HENT:     Head: Normocephalic.     Right Ear: Hearing and external ear normal.     Left Ear: Hearing and external ear normal.  Eyes:     General: Lids are normal.     Extraocular Movements: Extraocular movements intact.     Conjunctiva/sclera: Conjunctivae normal.  Neck:     Thyroid : No thyromegaly.     Vascular: No carotid  bruit.  Cardiovascular:     Rate and Rhythm: Normal rate and regular rhythm.     Heart sounds: Normal heart sounds. No murmur heard.    No gallop.  Pulmonary:     Effort: No accessory muscle usage or respiratory distress.     Breath sounds: Normal breath sounds. No decreased breath sounds, wheezing or rales.  Abdominal:     General: Bowel sounds are normal. There is no distension.     Palpations: Abdomen is soft.     Tenderness: There is no abdominal tenderness.  Musculoskeletal:     Cervical back: Full passive range of motion without pain.     Right lower leg: No edema.  Left lower leg: No edema.  Lymphadenopathy:     Cervical: No cervical adenopathy.  Skin:    General: Skin is warm.     Capillary Refill: Capillary refill takes less than 2 seconds.  Neurological:     Mental Status: He is alert and oriented to person, place, and time.     Deep Tendon Reflexes: Reflexes are normal and symmetric.     Reflex Scores:      Brachioradialis reflexes are 2+ on the right side and 2+ on the left side.      Patellar reflexes are 2+ on the right side and 2+ on the left side. Psychiatric:        Attention and Perception: Attention normal.        Mood and Affect: Mood normal.        Speech: Speech normal.        Behavior: Behavior normal. Behavior is cooperative.        Thought Content: Thought content normal.     Results for orders placed or performed in visit on 01/20/24  Bayer DCA Hb A1c Waived   Collection Time: 01/20/24  8:42 AM  Result Value Ref Range   HB A1C (BAYER DCA - WAIVED) 5.8 (H) 4.8 - 5.6 %  Microalbumin, Urine Waived   Collection Time: 01/20/24  8:42 AM  Result Value Ref Range   Microalb, Ur Waived 30 (H) 0 - 19 mg/L   Creatinine, Urine Waived 200 10 - 300 mg/dL   Microalb/Creat Ratio <30 <30 mg/g  CBC with Differential/Platelet   Collection Time: 01/20/24  8:43 AM  Result Value Ref Range   WBC 6.2 3.4 - 10.8 x10E3/uL   RBC 4.36 4.14 - 5.80 x10E6/uL    Hemoglobin 13.2 13.0 - 17.7 g/dL   Hematocrit 58.7 62.4 - 51.0 %   MCV 95 79 - 97 fL   MCH 30.3 26.6 - 33.0 pg   MCHC 32.0 31.5 - 35.7 g/dL   RDW 86.7 88.3 - 84.5 %   Platelets 191 150 - 450 x10E3/uL   Neutrophils 58 Not Estab. %   Lymphs 28 Not Estab. %   Monocytes 8 Not Estab. %   Eos 5 Not Estab. %   Basos 1 Not Estab. %   Neutrophils Absolute 3.6 1.4 - 7.0 x10E3/uL   Lymphocytes Absolute 1.7 0.7 - 3.1 x10E3/uL   Monocytes Absolute 0.5 0.1 - 0.9 x10E3/uL   EOS (ABSOLUTE) 0.3 0.0 - 0.4 x10E3/uL   Basophils Absolute 0.1 0.0 - 0.2 x10E3/uL   Immature Granulocytes 0 Not Estab. %   Immature Grans (Abs) 0.0 0.0 - 0.1 x10E3/uL  Comprehensive metabolic panel with GFR   Collection Time: 01/20/24  8:43 AM  Result Value Ref Range   Glucose 94 70 - 99 mg/dL   BUN 24 8 - 27 mg/dL   Creatinine, Ser 9.04 0.76 - 1.27 mg/dL   eGFR 89 >40 fO/fpw/8.26   BUN/Creatinine Ratio 25 (H) 10 - 24   Sodium 140 134 - 144 mmol/L   Potassium 3.8 3.5 - 5.2 mmol/L   Chloride 103 96 - 106 mmol/L   CO2 21 20 - 29 mmol/L   Calcium  9.3 8.6 - 10.2 mg/dL   Total Protein 6.8 6.0 - 8.5 g/dL   Albumin 4.4 3.9 - 4.9 g/dL   Globulin, Total 2.4 1.5 - 4.5 g/dL   Bilirubin Total 0.3 0.0 - 1.2 mg/dL   Alkaline Phosphatase 68 44 - 121 IU/L   AST 24 0 -  40 IU/L   ALT 23 0 - 44 IU/L  Lipid Panel w/o Chol/HDL Ratio   Collection Time: 01/20/24  8:43 AM  Result Value Ref Range   Cholesterol, Total 148 100 - 199 mg/dL   Triglycerides 836 (H) 0 - 149 mg/dL   HDL 36 (L) >60 mg/dL   VLDL Cholesterol Cal 28 5 - 40 mg/dL   LDL Chol Calc (NIH) 84 0 - 99 mg/dL  TSH   Collection Time: 01/20/24  8:43 AM  Result Value Ref Range   TSH 2.910 0.450 - 4.500 uIU/mL  PSA   Collection Time: 01/20/24  8:43 AM  Result Value Ref Range   Prostate Specific Ag, Serum 1.5 0.0 - 4.0 ng/mL      Assessment & Plan:   Problem List Items Addressed This Visit       Cardiovascular and Mediastinum   Hypertension - Primary   Chronic,  ongoing.  BP well below goal today. Recommend she monitor BP at least a few mornings a week at home and document.  DASH diet at home.  Continue current medication regimen and adjust as needed.  Labs today: CBC, CMP.  Refills up to date. Discussed with him if he maintains weight loss could consider reduction of medication in future, he wishes to hold off for now. Urine ALB 27 August 2024.        Relevant Orders   Microalbumin, Urine Waived   Comprehensive metabolic panel with GFR   CBC with Differential/Platelet     Respiratory   Severe obstructive sleep apnea   Ongoing and using CPAP 100% at this time.  Praised for this.        Nervous and Auditory   Neuropathy   Chronic, ongoing.  Followed by neurology, will continue this collaboration.  Recent notes reviewed and labs.        Other   Obesity   BMI 29.33 and has lost 37 lbs, praised for this.  Recommended eating smaller high protein, low fat meals more frequently and exercising 30 mins a day 5 times a week with a goal of 10-15lb weight loss in the next 3 months. Patient voiced their understanding and motivation to adhere to these recommendations.       Hyperlipidemia   Chronic, ongoing.  Continue current medication regimen and adjust as needed.  Lipid panel today.          Relevant Orders   Comprehensive metabolic panel with GFR   Lipid Panel w/o Chol/HDL Ratio   Elevated hemoglobin A1c measurement   5.8% in June 2025, since that time has been working on diet and exercise with 37 pounds lost. Praised for this. Today A1c 5.7%, trend down. Continue diet and exercise focus. Provided him with websites to visit to further get education.      Relevant Orders   Bayer DCA Hb A1c Waived   Depression, recurrent   Chronic, ongoing.  Denies SI/HI.  Will continue Prozac  at 40 MG and Wellbutrin  XL 300 MG at this time.  Discussed at length with patient medication and side effects.  Aware of BLACK BOX warning and to report immediately if  SI presents.          Follow up plan: Return in about 6 months (around 02/26/2025) for Annual Physical after 01/19/25.      "

## 2024-08-29 NOTE — Assessment & Plan Note (Signed)
 Chronic, ongoing.  Denies SI/HI.  Will continue Prozac  at 40 MG and Wellbutrin  XL 300 MG at this time.  Discussed at length with patient medication and side effects.  Aware of BLACK BOX warning and to report immediately if SI presents.

## 2024-08-29 NOTE — Assessment & Plan Note (Signed)
 BMI 29.33 and has lost 37 lbs, praised for this.  Recommended eating smaller high protein, low fat meals more frequently and exercising 30 mins a day 5 times a week with a goal of 10-15lb weight loss in the next 3 months. Patient voiced their understanding and motivation to adhere to these recommendations.

## 2024-08-29 NOTE — Assessment & Plan Note (Signed)
 Chronic, ongoing.  Continue current medication regimen and adjust as needed. Lipid panel today.

## 2024-08-29 NOTE — Assessment & Plan Note (Addendum)
 Chronic, ongoing.  BP well below goal today. Recommend she monitor BP at least a few mornings a week at home and document.  DASH diet at home.  Continue current medication regimen and adjust as needed.  Labs today: CBC, CMP.  Refills up to date. Discussed with him if he maintains weight loss could consider reduction of medication in future, he wishes to hold off for now. Urine ALB 27 August 2024.

## 2024-08-29 NOTE — Assessment & Plan Note (Addendum)
 5.8% in June 2025, since that time has been working on diet and exercise with 37 pounds lost. Praised for this. Today A1c 5.7%, trend down. Continue diet and exercise focus. Provided him with websites to visit to further get education.

## 2024-08-29 NOTE — Assessment & Plan Note (Signed)
 Ongoing and using CPAP 100% at this time.  Praised for this.

## 2024-08-30 ENCOUNTER — Ambulatory Visit: Payer: Self-pay | Admitting: Nurse Practitioner

## 2024-08-30 LAB — CBC WITH DIFFERENTIAL/PLATELET
Basophils Absolute: 0.1 x10E3/uL (ref 0.0–0.2)
Basos: 1 %
EOS (ABSOLUTE): 0.2 x10E3/uL (ref 0.0–0.4)
Eos: 5 %
Hematocrit: 40.8 % (ref 37.5–51.0)
Hemoglobin: 13.5 g/dL (ref 13.0–17.7)
Immature Grans (Abs): 0 x10E3/uL (ref 0.0–0.1)
Immature Granulocytes: 0 %
Lymphocytes Absolute: 1.3 x10E3/uL (ref 0.7–3.1)
Lymphs: 26 %
MCH: 30.9 pg (ref 26.6–33.0)
MCHC: 33.1 g/dL (ref 31.5–35.7)
MCV: 93 fL (ref 79–97)
Monocytes Absolute: 0.4 x10E3/uL (ref 0.1–0.9)
Monocytes: 8 %
Neutrophils Absolute: 3 x10E3/uL (ref 1.4–7.0)
Neutrophils: 60 %
Platelets: 174 x10E3/uL (ref 150–450)
RBC: 4.37 x10E6/uL (ref 4.14–5.80)
RDW: 12.1 % (ref 11.6–15.4)
WBC: 5.1 x10E3/uL (ref 3.4–10.8)

## 2024-08-30 LAB — COMPREHENSIVE METABOLIC PANEL WITH GFR
ALT: 25 IU/L (ref 0–44)
AST: 21 IU/L (ref 0–40)
Albumin: 4.5 g/dL (ref 3.9–4.9)
Alkaline Phosphatase: 60 IU/L (ref 47–123)
BUN/Creatinine Ratio: 19 (ref 10–24)
BUN: 20 mg/dL (ref 8–27)
Bilirubin Total: 0.4 mg/dL (ref 0.0–1.2)
CO2: 24 mmol/L (ref 20–29)
Calcium: 9.6 mg/dL (ref 8.6–10.2)
Chloride: 100 mmol/L (ref 96–106)
Creatinine, Ser: 1.05 mg/dL (ref 0.76–1.27)
Globulin, Total: 2.5 g/dL (ref 1.5–4.5)
Glucose: 97 mg/dL (ref 70–99)
Potassium: 4.5 mmol/L (ref 3.5–5.2)
Sodium: 140 mmol/L (ref 134–144)
Total Protein: 7 g/dL (ref 6.0–8.5)
eGFR: 79 mL/min/1.73

## 2024-08-30 LAB — LIPID PANEL W/O CHOL/HDL RATIO
Cholesterol, Total: 141 mg/dL (ref 100–199)
HDL: 45 mg/dL
LDL Chol Calc (NIH): 81 mg/dL (ref 0–99)
Triglycerides: 79 mg/dL (ref 0–149)
VLDL Cholesterol Cal: 15 mg/dL (ref 5–40)

## 2024-08-30 NOTE — Progress Notes (Signed)
 Contacted via MyChart  Good morning Peter Bennett, your labs have returned and overall remain stable. No changes needed at this [email or web address] Any questions on these? Keep being amazing!!  Thank you for allowing me to participate in your care.  I appreciate you. Kindest regards, Jla Reynolds

## 2025-02-27 ENCOUNTER — Encounter: Payer: Self-pay | Admitting: Nurse Practitioner
